# Patient Record
Sex: Female | Born: 1985 | Race: Black or African American | Hispanic: No | Marital: Single | Smoking: Current every day smoker
Health system: Southern US, Community
[De-identification: ages and names within clinical notes are randomized; demographics above are authoritative.]

## PROBLEM LIST (undated history)

## (undated) DIAGNOSIS — R51 Headache: Secondary | ICD-10-CM

## (undated) DIAGNOSIS — R519 Headache, unspecified: Secondary | ICD-10-CM

## (undated) DIAGNOSIS — N83209 Unspecified ovarian cyst, unspecified side: Secondary | ICD-10-CM

## (undated) DIAGNOSIS — I10 Essential (primary) hypertension: Secondary | ICD-10-CM

---

## 2010-03-30 ENCOUNTER — Emergency Department (HOSPITAL_BASED_OUTPATIENT_CLINIC_OR_DEPARTMENT_OTHER)
Admission: EM | Admit: 2010-03-30 | Discharge: 2010-03-30 | Payer: Self-pay | Source: Home / Self Care | Admitting: Emergency Medicine

## 2010-04-01 ENCOUNTER — Emergency Department (HOSPITAL_BASED_OUTPATIENT_CLINIC_OR_DEPARTMENT_OTHER)
Admission: EM | Admit: 2010-04-01 | Discharge: 2010-04-01 | Payer: Self-pay | Source: Home / Self Care | Admitting: Emergency Medicine

## 2010-06-25 LAB — URINALYSIS, ROUTINE W REFLEX MICROSCOPIC
Bilirubin Urine: NEGATIVE
Glucose, UA: NEGATIVE mg/dL
Ketones, ur: >80 mg/dL — AB
Nitrite: NEGATIVE
Nitrite: NEGATIVE
Specific Gravity, Urine: 1.018 (ref 1.005–1.030)
Specific Gravity, Urine: 1.023 (ref 1.005–1.030)
Urobilinogen, UA: 1 mg/dL (ref 0.0–1.0)
pH: 6 (ref 5.0–8.0)
pH: 6 (ref 5.0–8.0)

## 2010-06-25 LAB — BASIC METABOLIC PANEL
BUN: 8 mg/dL (ref 6–23)
Calcium: 8.5 mg/dL (ref 8.4–10.5)
Chloride: 104 mEq/L (ref 96–112)
Creatinine, Ser: 0.7 mg/dL (ref 0.4–1.2)
GFR calc Af Amer: >60 mL/min (ref >60)
GFR calc non Af Amer: >60 mL/min (ref >60)

## 2010-06-25 LAB — URINE MICROSCOPIC-ADD ON

## 2010-06-25 LAB — URINE CULTURE
Culture  Setup Time: 201112170628
Culture: NO GROWTH

## 2010-06-25 LAB — PREGNANCY, URINE
Preg Test, Ur: NEGATIVE
Preg Test, Ur: NEGATIVE

## 2010-06-25 LAB — WET PREP, GENITAL: Yeast Wet Prep HPF POC: NONE SEEN

## 2011-01-18 ENCOUNTER — Encounter: Payer: Self-pay | Admitting: *Deleted

## 2011-01-18 ENCOUNTER — Emergency Department (HOSPITAL_BASED_OUTPATIENT_CLINIC_OR_DEPARTMENT_OTHER): Payer: Medicaid Other

## 2011-01-18 ENCOUNTER — Emergency Department (HOSPITAL_BASED_OUTPATIENT_CLINIC_OR_DEPARTMENT_OTHER)
Admission: EM | Admit: 2011-01-18 | Discharge: 2011-01-19 | Disposition: A | Discharge status: Home / Self Care | Payer: Medicaid Other | Attending: Emergency Medicine | Admitting: Emergency Medicine

## 2011-01-18 DIAGNOSIS — F17200 Nicotine dependence, unspecified, uncomplicated: Secondary | ICD-10-CM | POA: Insufficient documentation

## 2011-01-18 DIAGNOSIS — J329 Chronic sinusitis, unspecified: Secondary | ICD-10-CM | POA: Insufficient documentation

## 2011-01-18 DIAGNOSIS — Z3201 Encounter for pregnancy test, result positive: Secondary | ICD-10-CM | POA: Insufficient documentation

## 2011-01-18 DIAGNOSIS — J069 Acute upper respiratory infection, unspecified: Secondary | ICD-10-CM | POA: Insufficient documentation

## 2011-01-18 LAB — URINALYSIS, ROUTINE W REFLEX MICROSCOPIC
Glucose, UA: NEGATIVE mg/dL
Ketones, ur: NEGATIVE mg/dL
Protein, ur: NEGATIVE mg/dL
Urobilinogen, UA: 1 mg/dL (ref 0.0–1.0)

## 2011-01-18 LAB — URINE MICROSCOPIC-ADD ON

## 2011-01-18 NOTE — ED Notes (Signed)
Pt presents to ED today with cold/URI sx for the last 3 weeks.  Pt has tried several otc remedies with no relief in sx.

## 2011-01-19 MED ORDER — CETIRIZINE-PSEUDOEPHEDRINE ER 5-120 MG PO TB12: 1.0000 | ORAL_TABLET | Freq: Every day | ORAL | Status: AC

## 2011-01-19 MED ORDER — ALBUTEROL SULFATE HFA 108 (90 BASE) MCG/ACT IN AERS
2.0000 | INHALATION_SPRAY | Freq: Once | RESPIRATORY_TRACT | Status: AC
  Administered 2011-01-19: 2 via RESPIRATORY_TRACT
  Filled 2011-01-19: qty 6.7

## 2011-01-19 MED ORDER — AZITHROMYCIN 250 MG PO TABS: 250.0000 mg | ORAL_TABLET | Freq: Every day | ORAL | Status: AC

## 2011-01-19 NOTE — ED Provider Notes (Signed)
History     CSN: [id number] Arrival date & time: 01/18/2011 11:32 PM  Chief Complaint  Patient presents with  . URI  . Cough    (Consider location/radiation/quality/duration/timing/severity/associated sxs/prior treatment) HPI Comments: 25 year old female with a history of approximately one month of gradual onset cough and nasal congestion. This came on the same time that her children develop similar symptoms. Her symptoms have been overall constant, daily, gradually worsening and now associated with significant postnasal drip, sinus tenderness on the left and a persistent cough. She denies fevers, vomiting, swelling or rashes. She has missed one menstrual period and is possibly pregnant.  Patient is a 25 y.o. female presenting with URI and cough. The history is provided by the patient.  URI The primary symptoms include cough.  Cough    History reviewed. No pertinent past medical history.  History reviewed. No pertinent past surgical history.  No family history on file.  History  Substance Use Topics  . Smoking status: Current Everyday Smoker -- 0.5 packs/day    Types: Cigarettes  . Smokeless tobacco: Not on file  . Alcohol Use:     OB History    Grav Para Term Preterm Abortions TAB SAB Ect Mult Living                  Review of Systems  Respiratory: Positive for cough.   All other systems reviewed and are negative.    Allergies  Review of patient's allergies indicates no known allergies.  Home Medications  No current outpatient prescriptions on file.  BP 114/98  Pulse 101  Temp(Src) 98.7 F (37.1 C) (Oral)  Resp 20  SpO2 99%  LMP 11/28/2010  Physical Exam  Nursing note and vitals reviewed. Constitutional: She appears well-developed and well-nourished. No distress.  HENT:  Head: Normocephalic and atraumatic.  Mouth/Throat: Oropharynx is clear and moist. No oropharyngeal exudate.       Significant nasal swelling of the turbinates bilaterally with  discharge. Tenderness over the left maxillary sinus  Eyes: Conjunctivae and EOM are normal. Pupils are equal, round, and reactive to light. Right eye exhibits no discharge. Left eye exhibits no discharge. No scleral icterus.  Neck: Normal range of motion. Neck supple. No JVD present. No thyromegaly present.  Cardiovascular: Normal rate, regular rhythm, normal heart sounds and intact distal pulses.  Exam reveals no gallop and no friction rub.   No murmur heard. Pulmonary/Chest: Effort normal and breath sounds normal. No respiratory distress. She has no wheezes. She has no rales.       Lungs clear, no rales no wheezing and normal vital signs including oxygen saturation of 99%  Abdominal: Soft. Bowel sounds are normal. She exhibits no distension and no mass. There is no tenderness.       Soft, nontender abdomen diffusely, no suprapubic tenderness.  Musculoskeletal: Normal range of motion. She exhibits no edema and no tenderness.  Lymphadenopathy:    She has no cervical adenopathy.  Neurological: She is alert. Coordination normal.  Skin: Skin is warm and dry. No rash noted. No erythema.  Psychiatric: She has a normal mood and affect. Her behavior is normal.    ED Course  Procedures (including critical care time)  Labs Reviewed  URINALYSIS, ROUTINE W REFLEX MICROSCOPIC - Abnormal; Notable for the following:    Leukocytes, UA SMALL (*)    All other components within normal limits  URINE MICROSCOPIC-ADD ON - Abnormal; Notable for the following:    Squamous Epithelial / LPF MANY (*)  All other components within normal limits  PREGNANCY, URINE   No results found.   No diagnosis found.    MDM  Lab testing shows pregnancy, patient states she wants to have an abortion. She knows where to go for this and has had one in the past. She declines x-ray as we will be treating her with antibiotics for sinusitis and upper respiratory infection. Albuterol MDI given in the emergency Department with  teaching and discharged the patient for home. Vital signs normal, gout pneumonia. Followup encouraged within 3-4 days        Vida Roller, MD 01/19/11 [id number]

## 2012-05-13 ENCOUNTER — Encounter (HOSPITAL_BASED_OUTPATIENT_CLINIC_OR_DEPARTMENT_OTHER): Payer: Self-pay | Admitting: *Deleted

## 2012-05-13 ENCOUNTER — Emergency Department (HOSPITAL_BASED_OUTPATIENT_CLINIC_OR_DEPARTMENT_OTHER)
Admission: EM | Admit: 2012-05-13 | Discharge: 2012-05-13 | Disposition: A | Discharge status: Home / Self Care | Payer: Self-pay | Attending: Emergency Medicine | Admitting: Emergency Medicine

## 2012-05-13 DIAGNOSIS — F17200 Nicotine dependence, unspecified, uncomplicated: Secondary | ICD-10-CM | POA: Insufficient documentation

## 2012-05-13 DIAGNOSIS — J069 Acute upper respiratory infection, unspecified: Secondary | ICD-10-CM | POA: Insufficient documentation

## 2012-05-13 DIAGNOSIS — I10 Essential (primary) hypertension: Secondary | ICD-10-CM | POA: Insufficient documentation

## 2012-05-13 DIAGNOSIS — IMO0001 Reserved for inherently not codable concepts without codable children: Secondary | ICD-10-CM | POA: Insufficient documentation

## 2012-05-13 DIAGNOSIS — R059 Cough, unspecified: Secondary | ICD-10-CM | POA: Insufficient documentation

## 2012-05-13 DIAGNOSIS — R05 Cough: Secondary | ICD-10-CM | POA: Insufficient documentation

## 2012-05-13 HISTORY — DX: Essential (primary) hypertension: I10

## 2012-05-13 LAB — RAPID STREP SCREEN (MED CTR MEBANE ONLY): Streptococcus, Group A Screen (Direct): NEGATIVE

## 2012-05-13 NOTE — ED Notes (Signed)
Pt c/o URi symptoms x 2 days 

## 2012-05-13 NOTE — ED Provider Notes (Signed)
Medical screening examination/treatment/procedure(s) were performed by non-physician practitioner and as supervising physician I was immediately available for consultation/collaboration.   Charles B. Bernette Mayers, MD 05/13/12 2312

## 2012-05-13 NOTE — ED Provider Notes (Signed)
History     CSN: [id number]  Arrival date & time 05/13/12  4098   First MD Initiated Contact with Patient 05/13/12 1909      Chief Complaint  Patient presents with  . URI    (Consider location/radiation/quality/duration/timing/severity/associated sxs/prior treatment) HPI Comments: Patient is a 27 year old female who presents with a 2 day history of sore throat, non productive cough, and myalgias. Patient reports a gradual onset and progressive worsening since the onset. Patient has tried OTC remedies which have provided some relief. No aggravating/alleviating factors. Patient reports sick contacts at school.    Past Medical History  Diagnosis Date  . Hypertension     History reviewed. No pertinent past surgical history.  History reviewed. No pertinent family history.  History  Substance Use Topics  . Smoking status: Current Every Day Smoker -- 0.5 packs/day    Types: Cigarettes  . Smokeless tobacco: Not on file  . Alcohol Use:     OB History    Grav Para Term Preterm Abortions TAB SAB Ect Mult Living                  Review of Systems  Constitutional: Positive for fatigue.  HENT: Positive for sore throat.   Respiratory: Positive for cough.   Musculoskeletal: Positive for myalgias.  All other systems reviewed and are negative.    Allergies  Review of patient's allergies indicates no known allergies.  Home Medications  No current outpatient prescriptions on file.  BP 110/72  Pulse 93  Temp 98.5 F (36.9 C) (Oral)  Resp 16  Ht 5\' 2"  (1.575 m)  Wt 123 lb (55.792 kg)  BMI 22.50 kg/m2  SpO2 100%  Physical Exam  Nursing note and vitals reviewed. Constitutional: She is oriented to person, place, and time. She appears well-developed and well-nourished. No distress.  HENT:  Head: Normocephalic and atraumatic.  Mouth/Throat: Oropharynx is clear and moist. No oropharyngeal exudate.       Pharyngeal erythema.   Eyes: Conjunctivae normal are normal.  Neck:  Normal range of motion. Neck supple.  Cardiovascular: Normal rate and regular rhythm.  Exam reveals no gallop and no friction rub.   No murmur heard. Pulmonary/Chest: Effort normal and breath sounds normal. She has no wheezes. She has no rales. She exhibits no tenderness.  Abdominal: Soft. She exhibits no distension. There is no tenderness. There is no rebound.  Musculoskeletal: Normal range of motion.  Neurological: She is alert and oriented to person, place, and time. Coordination normal.       Speech is goal-oriented. Moves limbs without ataxia.   Skin: Skin is warm and dry. She is not diaphoretic.  Psychiatric: She has a normal mood and affect. Her behavior is normal.    ED Course  Procedures (including critical care time)  Labs Reviewed - No data to display No results found.   1. URI, acute       MDM  7:23 PM Patient will have rapid strep test. Patient afebrile with stable vitals.   8:15 PM Strep test negative. Patient will be discharged with instructions for symptomatic treatment. Patient afebrile with stable vitals. Patient instructed to return with worsening or concerning symptoms.       Emilia Beck, PA-C 05/13/12 2016

## 2012-09-12 ENCOUNTER — Emergency Department (HOSPITAL_BASED_OUTPATIENT_CLINIC_OR_DEPARTMENT_OTHER)
Admission: EM | Admit: 2012-09-12 | Discharge: 2012-09-12 | Disposition: A | Discharge status: Home / Self Care | Payer: Medicaid Other | Attending: Emergency Medicine | Admitting: Emergency Medicine

## 2012-09-12 ENCOUNTER — Encounter (HOSPITAL_BASED_OUTPATIENT_CLINIC_OR_DEPARTMENT_OTHER): Payer: Self-pay

## 2012-09-12 DIAGNOSIS — I10 Essential (primary) hypertension: Secondary | ICD-10-CM | POA: Insufficient documentation

## 2012-09-12 DIAGNOSIS — M549 Dorsalgia, unspecified: Secondary | ICD-10-CM | POA: Insufficient documentation

## 2012-09-12 DIAGNOSIS — R10817 Generalized abdominal tenderness: Secondary | ICD-10-CM | POA: Insufficient documentation

## 2012-09-12 DIAGNOSIS — R51 Headache: Secondary | ICD-10-CM | POA: Insufficient documentation

## 2012-09-12 DIAGNOSIS — B349 Viral infection, unspecified: Secondary | ICD-10-CM

## 2012-09-12 DIAGNOSIS — R059 Cough, unspecified: Secondary | ICD-10-CM | POA: Insufficient documentation

## 2012-09-12 DIAGNOSIS — F17200 Nicotine dependence, unspecified, uncomplicated: Secondary | ICD-10-CM | POA: Insufficient documentation

## 2012-09-12 DIAGNOSIS — J029 Acute pharyngitis, unspecified: Secondary | ICD-10-CM | POA: Insufficient documentation

## 2012-09-12 DIAGNOSIS — Z3202 Encounter for pregnancy test, result negative: Secondary | ICD-10-CM | POA: Insufficient documentation

## 2012-09-12 DIAGNOSIS — K5900 Constipation, unspecified: Secondary | ICD-10-CM | POA: Insufficient documentation

## 2012-09-12 DIAGNOSIS — R070 Pain in throat: Secondary | ICD-10-CM | POA: Insufficient documentation

## 2012-09-12 DIAGNOSIS — R6883 Chills (without fever): Secondary | ICD-10-CM | POA: Insufficient documentation

## 2012-09-12 DIAGNOSIS — R05 Cough: Secondary | ICD-10-CM | POA: Insufficient documentation

## 2012-09-12 DIAGNOSIS — B9789 Other viral agents as the cause of diseases classified elsewhere: Secondary | ICD-10-CM | POA: Insufficient documentation

## 2012-09-12 LAB — URINALYSIS, ROUTINE W REFLEX MICROSCOPIC
Ketones, ur: NEGATIVE mg/dL
Nitrite: NEGATIVE
Protein, ur: NEGATIVE mg/dL
pH: 6 (ref 5.0–8.0)

## 2012-09-12 MED ORDER — HYDROCOD POLST-CHLORPHEN POLST 10-8 MG/5ML PO LQCR: 5.0000 mL | Freq: Two times a day (BID) | ORAL | Status: DC | PRN

## 2012-09-12 NOTE — ED Provider Notes (Signed)
History  This chart was scribed for Hanley Seamen, MD by Ardelia Mems, ED Scribe. This patient was seen in room MH04/MH04 and the patient's care was started at 11:21 PM.   CSN: [id number]  Arrival date & time 09/12/12  2053     Chief Complaint  Patient presents with  . General Malaise      The history is provided by the patient. No language interpreter was used.    HPI Comments: Elizabeth Duffy is a 27 y.o. female who presents to the Emergency Department complaining of general malaise. She reports moderate pain in her throat, head and back onset yesterday morning. Pt states that her throat is now mildly sore and has improved since yesterday. Pt states that she has dry cough. Pt states that she has not had a BM in 2 days and believes she is constipated. Pt states she has been having chills. Pt states that she has been taking BC and Aleve with no relief of pain. Pt denies rhinorrhea, congestion, fever, nausea, vomiting, diarrhea, ear aches or any other symptoms.   Past Medical History  Diagnosis Date  . Hypertension     History reviewed. No pertinent past surgical history.  No family history on file.  History  Substance Use Topics  . Smoking status: Current Every Day Smoker -- 0.50 packs/day    Types: Cigarettes  . Smokeless tobacco: Not on file  . Alcohol Use:     OB History   Grav Para Term Preterm Abortions TAB SAB Ect Mult Living                  Review of Systems  Constitutional: Positive for chills. Negative for fever.  HENT: Positive for sore throat. Negative for ear pain, congestion and rhinorrhea.   Respiratory: Positive for cough.   Gastrointestinal: Positive for constipation. Negative for nausea, vomiting and diarrhea.  Musculoskeletal: Positive for back pain.  Neurological: Positive for headaches.  All other systems reviewed and are negative.    Allergies  Review of patient's allergies indicates no known allergies.  Home Medications  No current  outpatient prescriptions on file.  Triage Vitals: BP 107/58  Pulse 86  Temp(Src) 98.7 F (37.1 C) (Oral)  Resp 18  Wt 121 lb (54.885 kg)  BMI 22.13 kg/m2  SpO2 99%  Physical Exam  Nursing note and vitals reviewed. Constitutional: She is oriented to person, place, and time. She appears well-developed and well-nourished.  HENT:  Head: Normocephalic and atraumatic.  Throat looks normal. No cervical lymphadenopathy.  Eyes: EOM are normal. Pupils are equal, round, and reactive to light.  Neck: Normal range of motion. Neck supple. No tracheal deviation present.  Cardiovascular: Normal rate, regular rhythm and normal heart sounds.   No murmur heard. Pulmonary/Chest: Effort normal and breath sounds normal. No respiratory distress.  Abdominal: Soft. Bowel sounds are normal. There is no tenderness.  Mild, diffuse abdominal tenderness.  Musculoskeletal: Normal range of motion. She exhibits no tenderness.  Neurological: She is alert and oriented to person, place, and time.  Skin: Skin is warm. No rash noted.    ED Course  Procedures (including critical care time)  DIAGNOSTIC STUDIES: Oxygen Saturation is 99% on RA, normal by my interpretation.    COORDINATION OF CARE: 11:27 PM- Pt advised of plan for treatment and pt agrees.      MDM   Nursing notes and vitals signs, including pulse oximetry, reviewed.  Summary of this visit's results, reviewed by myself:  Labs:  Results for orders placed during the hospital encounter of 09/12/12 (from the past 24 hour(s))  URINALYSIS, ROUTINE W REFLEX MICROSCOPIC     Status: Abnormal   Collection Time    09/12/12  9:18 PM      Result Value Range   Color, Urine YELLOW  YELLOW   APPearance CLOUDY (*) CLEAR   Specific Gravity, Urine 1.025  1.005 - 1.030   pH 6.0  5.0 - 8.0   Glucose, UA NEGATIVE  NEGATIVE mg/dL   Hgb urine dipstick NEGATIVE  NEGATIVE   Bilirubin Urine NEGATIVE  NEGATIVE   Ketones, ur NEGATIVE  NEGATIVE mg/dL    Protein, ur NEGATIVE  NEGATIVE mg/dL   Urobilinogen, UA 1.0  0.0 - 1.0 mg/dL   Nitrite NEGATIVE  NEGATIVE   Leukocytes, UA TRACE (*) NEGATIVE  PREGNANCY, URINE     Status: None   Collection Time    09/12/12  9:18 PM      Result Value Range   Preg Test, Ur NEGATIVE  NEGATIVE  URINE MICROSCOPIC-ADD ON     Status: Abnormal   Collection Time    09/12/12  9:18 PM      Result Value Range   Squamous Epithelial / LPF MANY (*) RARE   WBC, UA 3-6  <3 WBC/hpf   RBC / HPF 0-2  <3 RBC/hpf   Bacteria, UA MANY (*) RARE   Urine-Other MUCOUS PRESENT    RAPID STREP SCREEN     Status: None   Collection Time    09/12/12 11:31 PM      Result Value Range   Streptococcus, Group A Screen (Direct) NEGATIVE  NEGATIVE            I personally performed the services described in this documentation, which was scribed in my presence.  The recorded information has been reviewed and considered.    Hanley Seamen, MD 09/12/12 2351

## 2012-09-12 NOTE — ED Notes (Signed)
Patient here with general aching, headache, dry cough and congestion. Taking otc meds without relief

## 2012-09-12 NOTE — ED Notes (Signed)
MD at bedside. 

## 2012-09-13 ENCOUNTER — Emergency Department (HOSPITAL_BASED_OUTPATIENT_CLINIC_OR_DEPARTMENT_OTHER)
Admission: EM | Admit: 2012-09-13 | Discharge: 2012-09-14 | Disposition: A | Discharge status: Home / Self Care | Payer: Medicaid Other | Attending: Emergency Medicine | Admitting: Emergency Medicine

## 2012-09-13 ENCOUNTER — Encounter (HOSPITAL_BASED_OUTPATIENT_CLINIC_OR_DEPARTMENT_OTHER): Payer: Self-pay | Admitting: *Deleted

## 2012-09-13 DIAGNOSIS — F17200 Nicotine dependence, unspecified, uncomplicated: Secondary | ICD-10-CM | POA: Insufficient documentation

## 2012-09-13 DIAGNOSIS — I10 Essential (primary) hypertension: Secondary | ICD-10-CM | POA: Insufficient documentation

## 2012-09-13 DIAGNOSIS — R6883 Chills (without fever): Secondary | ICD-10-CM | POA: Insufficient documentation

## 2012-09-13 DIAGNOSIS — B9789 Other viral agents as the cause of diseases classified elsewhere: Secondary | ICD-10-CM | POA: Insufficient documentation

## 2012-09-13 DIAGNOSIS — B349 Viral infection, unspecified: Secondary | ICD-10-CM

## 2012-09-13 DIAGNOSIS — R51 Headache: Secondary | ICD-10-CM | POA: Insufficient documentation

## 2012-09-13 DIAGNOSIS — J3489 Other specified disorders of nose and nasal sinuses: Secondary | ICD-10-CM | POA: Insufficient documentation

## 2012-09-13 DIAGNOSIS — J029 Acute pharyngitis, unspecified: Secondary | ICD-10-CM | POA: Insufficient documentation

## 2012-09-13 DIAGNOSIS — IMO0001 Reserved for inherently not codable concepts without codable children: Secondary | ICD-10-CM | POA: Insufficient documentation

## 2012-09-13 NOTE — ED Notes (Signed)
Pt state she was seen here in the ED last and was not able to get her pain meds filled. States she feels much worse today. C/o pain to posterior neck down to her lower legs bilateral. C/o chills. Denies any fevers. Denies any sore throat. C/o dry cough.

## 2012-09-13 NOTE — ED Provider Notes (Signed)
History    This chart was scribed for Elizabeth Jakes, MD by Quintella Reichert, ED scribe.  This patient was seen in room MH07/MH07 and the patient's care was started at 10:43 PM.   CSN: [id number]  Arrival date & time 09/13/12  2055     Chief Complaint  Patient presents with  . coughing and congestion      Patient is a 27 y.o. female presenting with cough. The history is provided by the patient. No language interpreter was used.  Cough Cough characteristics:  Dry Severity:  Moderate Onset quality:  Gradual Duration:  2 days Progression:  Worsening Chronicity:  New Relieved by:  None tried Worsened by:  Nothing tried Ineffective treatments:  None tried Associated symptoms: chills, headaches and sore throat   Associated symptoms: no chest pain, no fever, no rash and no rhinorrhea   Associated symptoms comment:  Body aches, malaise   HPI Comments: Elizabeth Duffy is a 27 y.o. female who presents to the Emergency Department complaining of gradually-worsening generalized body aches that began 2 days ago, with accompanying malaise, dry cough, progressively-worsening headache in bilateral temples, sore throat, and intermittent chills and hot flashes.  Pt describes body aches as localized to posterior neck, lower back, and lower legs.  She was seen in the ED yesterday and diagnosed with a likely viral syndrome.  She has attempted to treat symptoms with [facility] Powder, Tylenol and Aleve, without relief.  She also notes she has not had a BM since symptoms began.  She denies objective fever, rhinorrhea, nausea, emesis, dysuria, CP, abdominal pain, or rash.  She denies h/o bleeding easily.      Past Medical History  Diagnosis Date  . Hypertension     History reviewed. No pertinent past surgical history.  No family history on file.  History  Substance Use Topics  . Smoking status: Current Every Day Smoker -- 0.50 packs/day    Types: Cigarettes  . Smokeless tobacco: Not on file  .  Alcohol Use: Yes     Comment: occasional     OB History   Grav Para Term Preterm Abortions TAB SAB Ect Mult Living                  Review of Systems  Constitutional: Positive for chills. Negative for fever.  HENT: Positive for sore throat. Negative for rhinorrhea.   Respiratory: Positive for cough.   Cardiovascular: Negative for chest pain.  Gastrointestinal: Negative for nausea, vomiting, abdominal pain and diarrhea.  Genitourinary: Negative for dysuria.  Skin: Negative for rash.  Neurological: Positive for headaches.  Hematological: Does not bruise/bleed easily.  Psychiatric/Behavioral: Negative for confusion.    Allergies  Review of patient's allergies indicates no known allergies.  Home Medications   Current Outpatient Rx  Name  Route  Sig  Dispense  Refill  . chlorpheniramine-HYDROcodone (TUSSIONEX PENNKINETIC ER) 10-8 MG/5ML LQCR   Oral   Take 5 mLs by mouth every 12 (twelve) hours as needed (for cough or pain).   115 mL   0     BP 109/69  Pulse 80  Temp(Src) 98.4 F (36.9 C) (Oral)  SpO2 98%  Physical Exam  Nursing note and vitals reviewed. Constitutional: She is oriented to person, place, and time. She appears well-developed and well-nourished. No distress.  HENT:  Head: Normocephalic and atraumatic.  Mouth/Throat: Oropharynx is clear and moist.  Eyes: Conjunctivae and EOM are normal. Pupils are equal, round, and reactive to light. No scleral icterus.  Neck: Normal range of motion. Neck supple.  Cardiovascular: Normal rate, regular rhythm and normal heart sounds.   No murmur heard. Pulmonary/Chest: Effort normal and breath sounds normal. No respiratory distress. She has no wheezes. She has no rales.  Abdominal: Soft. Bowel sounds are normal. There is no tenderness.  Musculoskeletal: Normal range of motion. She exhibits no edema and no tenderness.  Lymphadenopathy:    She has no cervical adenopathy.  Neurological: She is alert and oriented to person,  place, and time.  Skin: Skin is warm and dry. No rash noted.  Psychiatric: She has a normal mood and affect. Her behavior is normal.    ED Course  Procedures (including critical care time)  DIAGNOSTIC STUDIES: Oxygen Saturation is 98% on room air, normal by my interpretation.    COORDINATION OF CARE: 10:50 PM-Discussed treatment plan which includes further evaluation of pt's records with pt at bedside and pt agreed to plan.      Labs Reviewed  CBC WITH DIFFERENTIAL  BASIC METABOLIC PANEL   No results found. Results for orders placed during the hospital encounter of 09/12/12  RAPID STREP SCREEN      Result Value Range   Streptococcus, Group A Screen (Direct) NEGATIVE  NEGATIVE  URINALYSIS, ROUTINE W REFLEX MICROSCOPIC      Result Value Range   Color, Urine YELLOW  YELLOW   APPearance CLOUDY (*) CLEAR   Specific Gravity, Urine 1.025  1.005 - 1.030   pH 6.0  5.0 - 8.0   Glucose, UA NEGATIVE  NEGATIVE mg/dL   Hgb urine dipstick NEGATIVE  NEGATIVE   Bilirubin Urine NEGATIVE  NEGATIVE   Ketones, ur NEGATIVE  NEGATIVE mg/dL   Protein, ur NEGATIVE  NEGATIVE mg/dL   Urobilinogen, UA 1.0  0.0 - 1.0 mg/dL   Nitrite NEGATIVE  NEGATIVE   Leukocytes, UA TRACE (*) NEGATIVE  PREGNANCY, URINE      Result Value Range   Preg Test, Ur NEGATIVE  NEGATIVE  URINE MICROSCOPIC-ADD ON      Result Value Range   Squamous Epithelial / LPF MANY (*) RARE   WBC, UA 3-6  <3 WBC/hpf   RBC / HPF 0-2  <3 RBC/hpf   Bacteria, UA MANY (*) RARE   Urine-Other MUCOUS PRESENT       1. Viral illness       MDM  Patient seen last evening. With a negative urinalysis pregnancy test and strep test. Patient still not feeling well. Sore throat is pre-much resolved now has a dry cough bodyaches and the headache. No fevers. No nausea vomiting or diarrhea no real congestion. Suspect this is probably a viral illness. The patient would like to have further evaluation will do chest x-ray and basic labs.  The  patient no longer requires any medication for the cough. Anti-inflammatory medicine would probably be helpful.   I personally performed the services described in this documentation, which was scribed in my presence. The recorded information has been reviewed and is accurate.     Elizabeth Jakes, MD 09/13/12 2350

## 2012-09-14 ENCOUNTER — Emergency Department (HOSPITAL_BASED_OUTPATIENT_CLINIC_OR_DEPARTMENT_OTHER): Payer: Medicaid Other

## 2012-09-14 LAB — CBC WITH DIFFERENTIAL/PLATELET
Eosinophils Relative: 1 % (ref 0–5)
HCT: 32.9 % — ABNORMAL LOW (ref 36.0–46.0)
Hemoglobin: 11.3 g/dL — ABNORMAL LOW (ref 12.0–15.0)
Lymphocytes Relative: 31 % (ref 12–46)
Lymphs Abs: 1.1 10*3/uL (ref 0.7–4.0)
MCV: 86.1 fL (ref 78.0–100.0)
Monocytes Absolute: 0.5 10*3/uL (ref 0.1–1.0)
RBC: 3.82 MIL/uL — ABNORMAL LOW (ref 3.87–5.11)
WBC: 3.5 10*3/uL — ABNORMAL LOW (ref 4.0–10.5)

## 2012-09-14 LAB — URINE CULTURE: Colony Count: >=100000

## 2012-09-14 LAB — BASIC METABOLIC PANEL
CO2: 22 mEq/L (ref 19–32)
Calcium: 8.9 mg/dL (ref 8.4–10.5)
Creatinine, Ser: 0.7 mg/dL (ref 0.50–1.10)
Glucose, Bld: 112 mg/dL — ABNORMAL HIGH (ref 70–99)
Sodium: 135 mEq/L (ref 135–145)

## 2012-09-14 MED ORDER — HYDROCODONE-ACETAMINOPHEN 5-325 MG PO TABS
ORAL_TABLET | ORAL | Status: AC
  Administered 2012-09-14: 1
  Filled 2012-09-14: qty 1

## 2012-09-14 NOTE — ED Notes (Signed)
Patient transported to X-ray via stretcher 

## 2013-01-16 ENCOUNTER — Encounter (HOSPITAL_BASED_OUTPATIENT_CLINIC_OR_DEPARTMENT_OTHER): Payer: Self-pay | Admitting: Emergency Medicine

## 2013-01-16 ENCOUNTER — Emergency Department (HOSPITAL_BASED_OUTPATIENT_CLINIC_OR_DEPARTMENT_OTHER)
Admission: EM | Admit: 2013-01-16 | Discharge: 2013-01-16 | Disposition: A | Discharge status: Home / Self Care | Payer: Medicaid Other | Attending: Emergency Medicine | Admitting: Emergency Medicine

## 2013-01-16 DIAGNOSIS — F17200 Nicotine dependence, unspecified, uncomplicated: Secondary | ICD-10-CM | POA: Insufficient documentation

## 2013-01-16 DIAGNOSIS — B86 Scabies: Secondary | ICD-10-CM

## 2013-01-16 MED ORDER — PERMETHRIN 5 % EX CREA: TOPICAL_CREAM | CUTANEOUS | Status: DC

## 2013-01-16 NOTE — ED Provider Notes (Signed)
CSN: [id number]     Arrival date & time 01/16/13  3086 History   First MD Initiated Contact with Patient 01/16/13 0256     Chief Complaint  Patient presents with  . Rash   (Consider location/radiation/quality/duration/timing/severity/associated sxs/prior Treatment) HPI Pt presenting with pruritic rash on her back, stomach, arms- this has been present for approx 2 weeks after noticing her son had an itchy rash.  No fever.  States she has been scratching so hard she has had bleeding of the skin intermittently.  Has not had any treatment prior to arrival.  Tonight came to the ED with her 2 sons that have similar symptoms.  They were unable to sleep due to the itching.  There are no other associated systemic symptoms, there are no other alleviating or modifying factors.   History reviewed. No pertinent past medical history. History reviewed. No pertinent past surgical history. History reviewed. No pertinent family history. History  Substance Use Topics  . Smoking status: Current Every Day Smoker -- 0.50 packs/day    Types: Cigarettes  . Smokeless tobacco: Not on file  . Alcohol Use: Yes     Comment: occasional    OB History   Grav Para Term Preterm Abortions TAB SAB Ect Mult Living                 Review of Systems ROS reviewed and all otherwise negative except for mentioned in HPI  Allergies  Review of patient's allergies indicates no known allergies.  Home Medications   Current Outpatient Rx  Name  Route  Sig  Dispense  Refill  . chlorpheniramine-HYDROcodone (TUSSIONEX PENNKINETIC ER) 10-8 MG/5ML LQCR   Oral   Take 5 mLs by mouth every 12 (twelve) hours as needed (for cough or pain).   115 mL   0   . permethrin (ELIMITE) 5 % cream      Apply head to toe- leave on for 8-13 hours then rinse off.  You may repeat this treatment once if no improvement in 3-4 days   60 g   0    BP 108/67  Pulse 68  Temp(Src) 98.3 F (36.8 C) (Oral)  Resp 18  Ht 5\' 10"  (1.778 m)  Wt 133  lb (60.328 kg)  BMI 19.08 kg/m2  SpO2 100% Vitals reviewed Physical Exam  Physical Examination: General appearance - alert, well appearing, and in no distress Mental status - alert, oriented to person, place, and time Eyes - no conjunctival injection, no scleral icterus Chest - clear to auscultation, no wheezes, rales or rhonchi, symmetric air entry Heart - normal rate, regular rhythm, normal S1, S2, no murmurs, rubs, clicks or gallops Musculoskeletal - no joint tenderness, deformity or swelling Extremities - peripheral pulses normal, no pedal edema, no clubbing or cyanosis Skin - normal coloration and turgor, flesh colored papules over stomach, back, bilateral arms- some with burrows, some with excoriations present  ED Course  Procedures (including critical care time) Labs Review Labs Reviewed - No data to display Imaging Review No results found.  MDM   1. Scabies    Pt presenting with intensely pruritic rash most c/w scabies.  Other household contacts with similar symptoms as well.  Given rx for permethrin.  Benadryl as needed for itching.  Discharged with strict return precautions.  Pt agreeable with plan.   Ethelda Chick, MD 01/16/13 [phone]

## 2013-01-16 NOTE — ED Notes (Signed)
Mom noticed rash after being exposed to son's rash on her back, buttocks and legs, + uticaria, reports bleeding at times

## 2013-02-18 ENCOUNTER — Emergency Department (HOSPITAL_BASED_OUTPATIENT_CLINIC_OR_DEPARTMENT_OTHER): Payer: Medicaid Other

## 2013-02-18 ENCOUNTER — Emergency Department (HOSPITAL_BASED_OUTPATIENT_CLINIC_OR_DEPARTMENT_OTHER)
Admission: EM | Admit: 2013-02-18 | Discharge: 2013-02-19 | Disposition: A | Discharge status: Home / Self Care | Payer: Medicaid Other | Attending: Emergency Medicine | Admitting: Emergency Medicine

## 2013-02-18 ENCOUNTER — Encounter (HOSPITAL_BASED_OUTPATIENT_CLINIC_OR_DEPARTMENT_OTHER): Payer: Self-pay | Admitting: Emergency Medicine

## 2013-02-18 DIAGNOSIS — Z3202 Encounter for pregnancy test, result negative: Secondary | ICD-10-CM | POA: Insufficient documentation

## 2013-02-18 DIAGNOSIS — F17200 Nicotine dependence, unspecified, uncomplicated: Secondary | ICD-10-CM | POA: Insufficient documentation

## 2013-02-18 DIAGNOSIS — N390 Urinary tract infection, site not specified: Secondary | ICD-10-CM | POA: Insufficient documentation

## 2013-02-18 DIAGNOSIS — M545 Low back pain, unspecified: Secondary | ICD-10-CM | POA: Insufficient documentation

## 2013-02-18 DIAGNOSIS — M940 Chondrocostal junction syndrome [Tietze]: Secondary | ICD-10-CM | POA: Insufficient documentation

## 2013-02-18 DIAGNOSIS — R110 Nausea: Secondary | ICD-10-CM | POA: Insufficient documentation

## 2013-02-18 LAB — URINALYSIS, ROUTINE W REFLEX MICROSCOPIC
Specific Gravity, Urine: 1.022 (ref 1.005–1.030)
Urobilinogen, UA: 1 mg/dL (ref 0.0–1.0)

## 2013-02-18 LAB — PREGNANCY, URINE: Preg Test, Ur: NEGATIVE

## 2013-02-18 LAB — URINE MICROSCOPIC-ADD ON

## 2013-02-18 MED ORDER — NAPROXEN SODIUM 275 MG PO TABS: 275.0000 mg | ORAL_TABLET | Freq: Two times a day (BID) | ORAL | Status: DC | PRN

## 2013-02-18 MED ORDER — NITROFURANTOIN MONOHYD MACRO 100 MG PO CAPS
100.0000 mg | ORAL_CAPSULE | Freq: Once | ORAL | Status: AC
  Administered 2013-02-18: 100 mg via ORAL
  Filled 2013-02-18: qty 1

## 2013-02-18 MED ORDER — NITROFURANTOIN MONOHYD MACRO 100 MG PO CAPS: 100.0000 mg | ORAL_CAPSULE | Freq: Two times a day (BID) | ORAL | Status: DC

## 2013-02-18 MED ORDER — NAPROXEN 250 MG PO TABS
500.0000 mg | ORAL_TABLET | Freq: Once | ORAL | Status: AC
  Administered 2013-02-18: 500 mg via ORAL
  Filled 2013-02-18 (×2): qty 2

## 2013-02-18 NOTE — ED Notes (Signed)
Onset of chest and back pain last pm  States hurts to talk or cough or take a deep breath

## 2013-02-18 NOTE — ED Provider Notes (Signed)
CSN: [id number]     Arrival date & time 02/18/13  2109 History  This chart was scribed for Hanley Seamen, MD by Ronal Fear, ED Scribe. This patient was seen in room MH03/MH03 and the patient's care was started at 11:15 PM.     Chief Complaint  Patient presents with  . Chest Pain   Patient is a 27 y.o. female presenting with chest pain. The history is provided by the patient. No language interpreter was used.  Chest Pain   HPI Comments: Elizabeth Duffy is a 27 y.o. female who presents to the Emergency Department complaining of tight dull chest pain in her right sternal area with associated onset last night. Pain is moderate, and worse with breathing, talking or palpation. She denies cough, fever or vomiting. She has had some nausea. She also complains of lower back pain right above her sacrum. She has no other complaints.   History reviewed. No pertinent past medical history. History reviewed. No pertinent past surgical history. No family history on file. History  Substance Use Topics  . Smoking status: Current Every Day Smoker -- 0.50 packs/day    Types: Cigarettes  . Smokeless tobacco: Not on file  . Alcohol Use: Yes     Comment: occasional    OB History   Grav Para Term Preterm Abortions TAB SAB Ect Mult Living                 Review of Systems  Cardiovascular: Positive for chest pain.  10 Systems reviewed and are negative for acute change except as noted in the HPI.   Allergies  Review of patient's allergies indicates no known allergies.  Home Medications  No current outpatient prescriptions on file. BP 113/74  Pulse 65  Temp(Src) 97.8 F (36.6 C) (Oral)  Resp 15  SpO2 100%  Physical Exam General: Well-developed, well-nourished female in no acute distress; appearance consistent with age of record. HENT: normocephalic; atraumatic Eyes: pupils equal, round and reactive to light; extraocular muscles intact Neck: supple Heart: regular rate and rhythm; no  murmurs, rubs or gallops Lungs: clear to auscultation bilaterally Chest: right parasternal tenderness without deformity or crepitus Abdomen: soft; nondistended; nontender; no masses or hepatosplenomegaly; bowel sounds present Extremities: No deformity; full range of motion; pulses normal Neurologic: Awake, alert and oriented; motor function intact in all extremities and symmetric; no facial droop Skin: Warm and dry Psychiatric: Normal mood and affect   ED Course  Procedures (including critical care time) DIAGNOSTIC STUDIES: Oxygen Saturation is 100% on RA, normal by my interpretation.    COORDINATION OF CARE:    11:20 PM- Pt advised of plan for treatment including review of chest X-ray and UA results and pt agrees.  Imaging Review   EKG Interpretation     Ventricular Rate:  79 PR Interval:  148 QRS Duration: 78 QT Interval:  376 QTC Calculation: 431 R Axis:   67 Text Interpretation:  Normal sinus rhythm Normal ECG No previous ECGs available      MDM  Nursing notes and vitals signs, including pulse oximetry, reviewed.  Summary of this visit's results, reviewed by myself:  Labs:  Results for orders placed during the hospital encounter of 02/18/13 (from the past 24 hour(s))  URINALYSIS, ROUTINE W REFLEX MICROSCOPIC     Status: Abnormal   Collection Time    02/18/13 11:32 PM      Result Value Range   Color, Urine YELLOW  YELLOW   APPearance CLOUDY (*) CLEAR  Specific Gravity, Urine 1.022  1.005 - 1.030   pH 7.5  5.0 - 8.0   Glucose, UA NEGATIVE  NEGATIVE mg/dL   Hgb urine dipstick LARGE (*) NEGATIVE   Bilirubin Urine NEGATIVE  NEGATIVE   Ketones, ur NEGATIVE  NEGATIVE mg/dL   Protein, ur NEGATIVE  NEGATIVE mg/dL   Urobilinogen, UA 1.0  0.0 - 1.0 mg/dL   Nitrite POSITIVE (*) NEGATIVE   Leukocytes, UA SMALL (*) NEGATIVE  PREGNANCY, URINE     Status: None   Collection Time    02/18/13 11:32 PM      Result Value Range   Preg Test, Ur NEGATIVE  NEGATIVE  URINE  MICROSCOPIC-ADD ON     Status: Abnormal   Collection Time    02/18/13 11:32 PM      Result Value Range   Squamous Epithelial / LPF RARE  RARE   WBC, UA 7-10  <3 WBC/hpf   RBC / HPF 0-2  <3 RBC/hpf   Bacteria, UA MANY (*) RARE   Urine-Other AMORPHOUS URATES/PHOSPHATES      Imaging Studies: Dg Chest 2 View  02/18/2013   CLINICAL DATA:  Midsternal chest pain since last night.  EXAM: CHEST  2 VIEW  COMPARISON:  Chest x-ray 09/14/2012.  FINDINGS: Lung volumes are normal. No consolidative airspace disease. No pleural effusions. No pneumothorax. No pulmonary nodule or mass noted. Pulmonary vasculature and the cardiomediastinal silhouette are within normal limits.  IMPRESSION: 1.  No radiographic evidence of acute cardiopulmonary disease.   Electronically Signed   By: Trudie Reed M.D.   On: 02/18/2013 22:38    EKG Interpretation     Ventricular Rate:  79 PR Interval:  148 QRS Duration: 78 QT Interval:  376 QTC Calculation: 431 R Axis:   67 Text Interpretation:  Normal sinus rhythm Normal ECG No previous ECGs available       I personally performed the services described in this documentation, which was scribed in my presence.  The recorded information has been reviewed and is accurate.   Hanley Seamen, MD 02/18/13 2352

## 2013-02-21 LAB — URINE CULTURE

## 2013-02-24 ENCOUNTER — Telehealth (HOSPITAL_COMMUNITY): Payer: Self-pay | Admitting: Emergency Medicine

## 2013-02-24 NOTE — ED Notes (Signed)
Post ED Visit - Positive Culture Follow-up  Culture report reviewed by antimicrobial stewardship pharmacist: []  Wes Dulaney, Pharm.D., BCPS [x]  Celedonio Miyamoto, Pharm.D., BCPS []  Georgina Pillion, Pharm.D., BCPS []  [place], [place].D., BCPS, AAHIVP []  Estella Husk, Pharm.D., BCPS, AAHIVP  Positive urine culture Treated with Macrobid, organism sensitive to the same and no further patient follow-up is required at this time.  Kylie A Holland 02/24/2013, 8:11 AM

## 2015-05-06 ENCOUNTER — Encounter (HOSPITAL_BASED_OUTPATIENT_CLINIC_OR_DEPARTMENT_OTHER): Payer: Self-pay

## 2015-05-06 ENCOUNTER — Emergency Department (HOSPITAL_BASED_OUTPATIENT_CLINIC_OR_DEPARTMENT_OTHER)
Admission: EM | Admit: 2015-05-06 | Discharge: 2015-05-06 | Disposition: A | Discharge status: Home / Self Care | Payer: Medicaid Other | Attending: Emergency Medicine | Admitting: Emergency Medicine

## 2015-05-06 DIAGNOSIS — R0981 Nasal congestion: Secondary | ICD-10-CM | POA: Diagnosis present

## 2015-05-06 DIAGNOSIS — R52 Pain, unspecified: Secondary | ICD-10-CM | POA: Diagnosis not present

## 2015-05-06 DIAGNOSIS — Z3202 Encounter for pregnancy test, result negative: Secondary | ICD-10-CM | POA: Diagnosis not present

## 2015-05-06 DIAGNOSIS — R509 Fever, unspecified: Secondary | ICD-10-CM | POA: Insufficient documentation

## 2015-05-06 DIAGNOSIS — F17210 Nicotine dependence, cigarettes, uncomplicated: Secondary | ICD-10-CM | POA: Diagnosis not present

## 2015-05-06 DIAGNOSIS — N938 Other specified abnormal uterine and vaginal bleeding: Secondary | ICD-10-CM | POA: Insufficient documentation

## 2015-05-06 DIAGNOSIS — J029 Acute pharyngitis, unspecified: Secondary | ICD-10-CM | POA: Insufficient documentation

## 2015-05-06 LAB — URINALYSIS, ROUTINE W REFLEX MICROSCOPIC
Bilirubin Urine: NEGATIVE
Glucose, UA: NEGATIVE mg/dL
Ketones, ur: 15 mg/dL — AB
NITRITE: NEGATIVE
PROTEIN: 30 mg/dL — AB
Specific Gravity, Urine: 1.026 (ref 1.005–1.030)
pH: 7.5 (ref 5.0–8.0)

## 2015-05-06 LAB — URINE MICROSCOPIC-ADD ON

## 2015-05-06 LAB — PREGNANCY, URINE: PREG TEST UR: NEGATIVE

## 2015-05-06 LAB — RAPID STREP SCREEN (MED CTR MEBANE ONLY): STREPTOCOCCUS, GROUP A SCREEN (DIRECT): NEGATIVE

## 2015-05-06 MED ORDER — IBUPROFEN 800 MG PO TABS
ORAL_TABLET | ORAL | Status: AC
  Filled 2015-05-06: qty 1

## 2015-05-06 MED ORDER — SULFAMETHOXAZOLE-TRIMETHOPRIM 800-160 MG PO TABS: 1.0000 | ORAL_TABLET | Freq: Two times a day (BID) | ORAL | Status: AC

## 2015-05-06 MED ORDER — IBUPROFEN 800 MG PO TABS
800.0000 mg | ORAL_TABLET | Freq: Once | ORAL | Status: AC
  Administered 2015-05-06: 800 mg via ORAL

## 2015-05-06 NOTE — ED Notes (Signed)
Patient here with body aches, sore throat, fever x 1 day. Has not taken fever medication. No distress

## 2015-05-06 NOTE — ED Notes (Signed)
MD at bedside. 

## 2015-05-06 NOTE — ED Provider Notes (Signed)
CSN: [id number]     Arrival date & time 05/06/15  1352 History  By signing my name below, I, Elizabeth Duffy, attest that this documentation has been prepared under the direction and in the presence of Blane Ohara, MD. Electronically Signed: Bethel Duffy, ED Scribe. 05/06/2015. 4:16 PM   Chief Complaint  Patient presents with  . Generalized Body Aches  . Nasal Congestion    The history is provided by the patient. No language interpreter was used.   Elizabeth Duffy is a 30 y.o. female who presents to the Emergency Department complaining of new, constant, 10/10 in severity at worst, generalized myalgias with onset last night. Pt states that last night after a bath she noted aching from head to toe. She took an OTC pain reliever with some improvement last night but woke up with the same pain.   Associated symptoms include sore throat, cough, fever, notes that she is currently menstruating). Pt denies abnormal vaginal discharge and no new sexual partners. She last had sexual intercourse 4 months ago. Pt is also concerned because this is the first menstrual period that she has had since October 2016 and the blood appears dark purple.    History reviewed. No pertinent past medical history. History reviewed. No pertinent past surgical history. No family history on file. Social History  Substance Use Topics  . Smoking status: Current Every Day Smoker -- 0.50 packs/day    Types: Cigarettes  . Smokeless tobacco: None  . Alcohol Use: Yes     Comment: occasional    OB History    No data available     Review of Systems  Constitutional: Positive for fever.  HENT: Positive for sore throat.   Respiratory: Positive for cough.   Gastrointestinal: Positive for abdominal pain.  Genitourinary: Positive for vaginal bleeding (menstruating) and menstrual problem. Negative for vaginal discharge.  All other systems reviewed and are negative.   Allergies  Review of patient's allergies  indicates no known allergies.  Home Medications   Prior to Admission medications   Medication Sig Start Date End Date Taking? Authorizing Provider  sulfamethoxazole-trimethoprim (BACTRIM DS,SEPTRA DS) 800-160 MG tablet Take 1 tablet by mouth 2 (two) times daily. 05/06/15 05/13/15  Blane Ohara, MD   BP 132/69 mmHg  Pulse 102  Temp(Src) 100.3 F (37.9 C) (Oral)  Resp 20  Ht  (1.575 m)  Wt 145 lb (65.772 kg)  BMI 26.51 kg/m2  SpO2 96% Physical Exam  Constitutional: She is oriented to person, place, and time. She appears well-developed and well-nourished.  HENT:  Head: Normocephalic.  Mouth/Throat: Posterior oropharyngeal erythema present. No oropharyngeal exudate or posterior oropharyngeal edema.  Eyes: EOM are normal.  Neck: Normal range of motion.  Cardiovascular: Normal rate.   Pulmonary/Chest: Effort normal.  CTAB  Abdominal: She exhibits no distension. There is no tenderness.  No focal abdominal tenderness on exam  Musculoskeletal: Normal range of motion.  Diffuse tenderness at the musculature of the back.   Neurological: She is alert and oriented to person, place, and time.  Psychiatric: She has a normal mood and affect.  Nursing note and vitals reviewed.   ED Course  Procedures (including critical care time) DIAGNOSTIC STUDIES: Oxygen Saturation is 96% on RA,  normal by my interpretation.    COORDINATION OF CARE: 3:57 PM Discussed treatment plan which includes lab work and ibuprofen with pt at bedside and pt agreed to plan.  Labs Review Labs Reviewed  URINALYSIS, ROUTINE W REFLEX MICROSCOPIC (NOT AT [facility]) -  Abnormal; Notable for the following:    APPearance CLOUDY (*)    Hgb urine dipstick SMALL (*)    Ketones, ur 15 (*)    Protein, ur 30 (*)    Leukocytes, UA TRACE (*)    All other components within normal limits  URINE MICROSCOPIC-ADD ON - Abnormal; Notable for the following:    Squamous Epithelial / LPF 6-30 (*)    Bacteria, UA MANY (*)    All other  components within normal limits  RAPID STREP SCREEN (NOT AT [facility])  CULTURE, GROUP A STREP [facility])  URINE CULTURE  PREGNANCY, URINE    Imaging Review No results found. I have personally reviewed and evaluated these lab results as part of my medical decision-making.   EKG Interpretation None      MDM   Final diagnoses:  Body aches  Fever, unspecified fever cause   Patient presents with bodyaches, sore throat clinically concern for viral however patient has mild back pain in early pyelonephritis on the differential. Urinalysis any bacteria however also many squamous. Plan to start oral antibiotics and patient will follow-up culture result to see if she needs to continue.  Results and differential diagnosis were discussed with the patient/parent/guardian. Xrays were independently reviewed by myself.  Close follow up outpatient was discussed, comfortable with the plan.   Medications  ibuprofen (ADVIL,MOTRIN) tablet 800 mg (800 mg Oral Given 05/06/15 1407)    Filed Vitals:   05/06/15 1404 05/06/15 1534  BP: 132/69 116/67  Pulse: 102 93  Temp: 100.3 F (37.9 C) 99.7 F (37.6 C)  TempSrc: Oral Oral  Resp: 20 18  Height:  (1.575 m)   Weight: 145 lb (65.772 kg)   SpO2: 96% 98%    Final diagnoses:  Body aches  Fever, unspecified fever cause      Blane Ohara, MD 05/06/15 [phone]

## 2015-05-06 NOTE — Discharge Instructions (Signed)
If you were given medicines take as directed.  If you are on coumadin or contraceptives realize their levels and effectiveness is altered by many different medicines.  If you have any reaction (rash, tongues swelling, other) to the medicines stop taking and see a physician.   Continue tylenol every 4 hrs and motrin every 6 hrs.   It is important to follow-up urine culture result because if it is negative stop your antibiotics. If your blood pressure was elevated in the ER make sure you follow up for management with a primary doctor or return for chest pain, shortness of breath or stroke symptoms.  Please follow up as directed and return to the ER or see a physician for new or worsening symptoms.  Thank you. Filed Vitals:   05/06/15 1404 05/06/15 1534  BP: 132/69 116/67  Pulse: 102 93  Temp: 100.3 F (37.9 C) 99.7 F (37.6 C)  TempSrc: Oral Oral  Resp: 20 18  Height:  (1.575 m)   Weight: 145 lb (65.772 kg)   SpO2: 96% 98%

## 2015-05-08 LAB — URINE CULTURE

## 2015-05-09 LAB — CULTURE, GROUP A STREP (THRC)

## 2015-08-30 ENCOUNTER — Emergency Department (HOSPITAL_BASED_OUTPATIENT_CLINIC_OR_DEPARTMENT_OTHER)
Admission: EM | Admit: 2015-08-30 | Discharge: 2015-08-30 | Disposition: A | Discharge status: Home / Self Care | Payer: Medicaid Other | Attending: Emergency Medicine | Admitting: Emergency Medicine

## 2015-08-30 ENCOUNTER — Encounter (HOSPITAL_BASED_OUTPATIENT_CLINIC_OR_DEPARTMENT_OTHER): Payer: Self-pay

## 2015-08-30 DIAGNOSIS — N3000 Acute cystitis without hematuria: Secondary | ICD-10-CM | POA: Insufficient documentation

## 2015-08-30 DIAGNOSIS — N898 Other specified noninflammatory disorders of vagina: Secondary | ICD-10-CM | POA: Diagnosis present

## 2015-08-30 DIAGNOSIS — F17210 Nicotine dependence, cigarettes, uncomplicated: Secondary | ICD-10-CM | POA: Diagnosis not present

## 2015-08-30 DIAGNOSIS — A5901 Trichomonal vulvovaginitis: Secondary | ICD-10-CM | POA: Diagnosis not present

## 2015-08-30 HISTORY — DX: Headache, unspecified: R51.9

## 2015-08-30 HISTORY — DX: Unspecified ovarian cyst, unspecified side: N83.209

## 2015-08-30 HISTORY — DX: Headache: R51

## 2015-08-30 LAB — URINALYSIS, ROUTINE W REFLEX MICROSCOPIC
BILIRUBIN URINE: NEGATIVE
GLUCOSE, UA: NEGATIVE mg/dL
KETONES UR: 15 mg/dL — AB
NITRITE: NEGATIVE
PH: 6.5 (ref 5.0–8.0)
Protein, ur: 30 mg/dL — AB
SPECIFIC GRAVITY, URINE: 1.017 (ref 1.005–1.030)

## 2015-08-30 LAB — WET PREP, GENITAL
Clue Cells Wet Prep HPF POC: NONE SEEN
SPERM: NONE SEEN
YEAST WET PREP: NONE SEEN

## 2015-08-30 LAB — URINE MICROSCOPIC-ADD ON

## 2015-08-30 LAB — PREGNANCY, URINE: Preg Test, Ur: NEGATIVE

## 2015-08-30 MED ORDER — AZITHROMYCIN 250 MG PO TABS
1000.0000 mg | ORAL_TABLET | Freq: Once | ORAL | Status: AC
Start: 2015-08-30 — End: 2015-08-30
  Administered 2015-08-30: 1000 mg via ORAL
  Filled 2015-08-30: qty 4

## 2015-08-30 MED ORDER — HYDROCODONE-ACETAMINOPHEN 5-325 MG PO TABS
2.0000 | ORAL_TABLET | Freq: Once | ORAL | Status: AC
  Administered 2015-08-30: 2 via ORAL
  Filled 2015-08-30: qty 2

## 2015-08-30 MED ORDER — CEFTRIAXONE SODIUM 1 G IJ SOLR
1.0000 g | Freq: Once | INTRAMUSCULAR | Status: AC
  Administered 2015-08-30: 1 g via INTRAMUSCULAR
  Filled 2015-08-30: qty 10

## 2015-08-30 MED ORDER — CEPHALEXIN 500 MG PO CAPS: 500.0000 mg | ORAL_CAPSULE | Freq: Four times a day (QID) | ORAL | Status: DC

## 2015-08-30 MED ORDER — METRONIDAZOLE 500 MG PO TABS: 500.0000 mg | ORAL_TABLET | Freq: Two times a day (BID) | ORAL | Status: DC

## 2015-08-30 MED ORDER — HYDROCODONE-ACETAMINOPHEN 5-325 MG PO TABS: 1.0000 | ORAL_TABLET | Freq: Four times a day (QID) | ORAL | Status: DC | PRN

## 2015-08-30 NOTE — ED Notes (Signed)
MD at bedside. 

## 2015-08-30 NOTE — ED Notes (Signed)
C/o pain to entire back, vaginal pain, HA started 5am-pt tearful and presents to triage in w/c

## 2015-08-30 NOTE — Discharge Instructions (Signed)
Keflex and Flagyl as prescribed.  Hydrocodone as prescribed as needed for pain.  We will call you if your cultures indicate you require further treatment.  Return to the ER symptoms significantly worsen or change.   Urinary Tract Infection Urinary tract infections (UTIs) can develop anywhere along your urinary tract. Your urinary tract is your body's drainage system for removing wastes and extra water. Your urinary tract includes two kidneys, two ureters, a bladder, and a urethra. Your kidneys are a pair of bean-shaped organs. Each kidney is about the size of your fist. They are located below your ribs, one on each side of your spine. CAUSES Infections are caused by microbes, which are microscopic organisms, including fungi, viruses, and bacteria. These organisms are so small that they can only be seen through a microscope. Bacteria are the microbes that most commonly cause UTIs. SYMPTOMS  Symptoms of UTIs may vary by age and gender of the patient and by the location of the infection. Symptoms in young women typically include a frequent and intense urge to urinate and a painful, burning feeling in the bladder or urethra during urination. Older women and men are more likely to be tired, shaky, and weak and have muscle aches and abdominal pain. A fever may mean the infection is in your kidneys. Other symptoms of a kidney infection include pain in your back or sides below the ribs, nausea, and vomiting. DIAGNOSIS To diagnose a UTI, your caregiver will ask you about your symptoms. Your caregiver will also ask you to provide a urine sample. The urine sample will be tested for bacteria and white blood cells. White blood cells are made by your body to help fight infection. TREATMENT  Typically, UTIs can be treated with medication. Because most UTIs are caused by a bacterial infection, they usually can be treated with the use of antibiotics. The choice of antibiotic and length of treatment depend on your  symptoms and the type of bacteria causing your infection. HOME CARE INSTRUCTIONS  If you were prescribed antibiotics, take them exactly as your caregiver instructs you. Finish the medication even if you feel better after you have only taken some of the medication.  Drink enough water and fluids to keep your urine clear or pale yellow.  Avoid caffeine, tea, and carbonated beverages. They tend to irritate your bladder.  Empty your bladder often. Avoid holding urine for long periods of time.  Empty your bladder before and after sexual intercourse.  After a bowel movement, women should cleanse from front to back. Use each tissue only once. SEEK MEDICAL CARE IF:   You have back pain.  You develop a fever.  Your symptoms do not begin to resolve within 3 days. SEEK IMMEDIATE MEDICAL CARE IF:   You have severe back pain or lower abdominal pain.  You develop chills.  You have nausea or vomiting.  You have continued burning or discomfort with urination. MAKE SURE YOU:   Understand these instructions.  Will watch your condition.  Will get help right away if you are not doing well or get worse.   This information is not intended to replace advice given to you by your health care provider. Make sure you discuss any questions you have with your health care provider.   Document Released: 01/09/2005 Document Revised: 12/21/2014 Document Reviewed: 05/10/2011 Elsevier Interactive Patient Education 2016 [employer].  Trichomoniasis Trichomoniasis is an infection caused by an organism called Trichomonas. The infection can affect both women and men. In women, the  outer female genitalia and the vagina are affected. In men, the penis is mainly affected, but the prostate and other reproductive organs can also be involved. Trichomoniasis is a sexually transmitted infection (STI) and is most often passed to another person through sexual contact.  RISK FACTORS  Having unprotected sexual  intercourse.  Having sexual intercourse with an infected partner. SIGNS AND SYMPTOMS  Symptoms of trichomoniasis in women include:  Abnormal gray-green frothy vaginal discharge.  Itching and irritation of the vagina.  Itching and irritation of the area outside the vagina. Symptoms of trichomoniasis in men include:   Penile discharge with or without pain.  Pain during urination. This results from inflammation of the urethra. DIAGNOSIS  Trichomoniasis may be found during a Pap test or physical exam. Your health care provider may use one of the following methods to help diagnose this infection:  Testing the pH of the vagina with a test tape.  Using a vaginal swab test that checks for the Trichomonas organism. A test is available that provides results within a few minutes.  Examining a urine sample.  Testing vaginal secretions. Your health care provider may test you for other STIs, including HIV. TREATMENT   You may be given medicine to fight the infection. Women should inform their health care provider if they could be or are pregnant. Some medicines used to treat the infection should not be taken during pregnancy.  Your health care provider may recommend over-the-counter medicines or creams to decrease itching or irritation.  Your sexual partner will need to be treated if infected.  Your health care provider may test you for infection again 3 months after treatment. HOME CARE INSTRUCTIONS   Take medicines only as directed by your health care provider.  Take over-the-counter medicine for itching or irritation as directed by your health care provider.  Do not have sexual intercourse while you have the infection.  Women should not douche or wear tampons while they have the infection.  Discuss your infection with your partner. Your partner may have gotten the infection from you, or you may have gotten it from your partner.  Have your sex partner get examined and treated if  necessary.  Practice safe, informed, and protected sex.  See your health care provider for other STI testing. SEEK MEDICAL CARE IF:   You still have symptoms after you finish your medicine.  You develop abdominal pain.  You have pain when you urinate.  You have bleeding after sexual intercourse.  You develop a rash.  Your medicine makes you sick or makes you throw up (vomit). MAKE SURE YOU:  Understand these instructions.  Will watch your condition.  Will get help right away if you are not doing well or get worse.   This information is not intended to replace advice given to you by your health care provider. Make sure you discuss any questions you have with your health care provider.   Document Released: 09/25/2000 Document Revised: 04/22/2014 Document Reviewed: 01/11/2013 Elsevier Interactive Patient Education [employer] Elsevier Inc.

## 2015-08-30 NOTE — ED Provider Notes (Signed)
CSN: [id number]     Arrival date & time 08/30/15  1433 History   First MD Initiated Contact with Patient 08/30/15 1501     Chief Complaint  Patient presents with  . Vaginal Discharge     (Consider location/radiation/quality/duration/timing/severity/associated sxs/prior Treatment) HPI Comments: Patient is a 30 year old female with no significant past medical history. She presents for evaluation of vaginal discharge and lower abdominal discomfort. This is been going on for the past 2 days. She denies any fevers or chills. She denies any urinary complaints she is currently menstruating. She denies any new sexual contacts.  Patient is a 30 y.o. female presenting with vaginal discharge. The history is provided by the patient.  Vaginal Discharge Quality:  White Severity:  Moderate Onset quality:  Gradual Timing:  Constant Progression:  Worsening Chronicity:  New Relieved by:  Nothing Worsened by:  Nothing tried Ineffective treatments:  None tried   Past Medical History  Diagnosis Date  . Ovarian cyst   . HA (headache)    History reviewed. No pertinent past surgical history. No family history on file. Social History  Substance Use Topics  . Smoking status: Current Every Day Smoker -- 0.50 packs/day    Types: Cigarettes  . Smokeless tobacco: None  . Alcohol Use: Yes     Comment: occasional    OB History    No data available     Review of Systems  Genitourinary: Positive for vaginal discharge.  All other systems reviewed and are negative.     Allergies  Review of patient's allergies indicates no known allergies.  Home Medications   Prior to Admission medications   Not on File   BP 113/69 mmHg  Pulse 92  Temp(Src) 99 F (37.2 C) (Oral)  Resp 20  Ht 5\' 2"  (1.575 m)  Wt 140 lb (63.504 kg)  BMI 25.60 kg/m2  SpO2 100%  LMP 08/28/2015 Physical Exam  Constitutional: She is oriented to person, place, and time. She appears well-developed and well-nourished. No  distress.  HENT:  Head: Normocephalic and atraumatic.  Neck: Normal range of motion. Neck supple.  Cardiovascular: Normal rate and regular rhythm.  Exam reveals no gallop and no friction rub.   No murmur heard. Pulmonary/Chest: Effort normal and breath sounds normal. No respiratory distress. She has no wheezes.  Abdominal: Soft. Bowel sounds are normal. She exhibits no distension. There is tenderness. There is no rebound and no guarding.  There is tenderness to palpation in the suprapubic region.  Musculoskeletal: Normal range of motion.  Neurological: She is alert and oriented to person, place, and time.  Skin: Skin is warm and dry. She is not diaphoretic.  Nursing note and vitals reviewed.   ED Course  Procedures (including critical care time) Labs Review Labs Reviewed  URINALYSIS, ROUTINE W REFLEX MICROSCOPIC (NOT AT [facility]) - Abnormal; Notable for the following:    APPearance CLOUDY (*)    Hgb urine dipstick LARGE (*)    Ketones, ur 15 (*)    Protein, ur 30 (*)    Leukocytes, UA LARGE (*)    All other components within normal limits  URINE MICROSCOPIC-ADD ON - Abnormal; Notable for the following:    Squamous Epithelial / LPF 6-30 (*)    Bacteria, UA FEW (*)    All other components within normal limits  WET PREP, GENITAL  PREGNANCY, URINE  GC/CHLAMYDIA PROBE AMP ([place]) NOT AT [facility]    Imaging Review No results found. I have personally reviewed and evaluated these  images and lab results as part of my medical decision-making.    MDM   Final diagnoses:  None    Urinalysis reveals a UTI. She will be given Rocephin and Keflex. Her wet prep also shows many WBCs and trichomonas. She will be given Flagyl.    Geoffery Lyons, MD 08/30/15 [phone]

## 2015-08-31 LAB — GC/CHLAMYDIA PROBE AMP (~~LOC~~) NOT AT ARMC
Chlamydia: NEGATIVE
NEISSERIA GONORRHEA: NEGATIVE

## 2016-01-23 ENCOUNTER — Emergency Department (HOSPITAL_BASED_OUTPATIENT_CLINIC_OR_DEPARTMENT_OTHER): Payer: Medicaid Other

## 2016-01-23 ENCOUNTER — Emergency Department (HOSPITAL_BASED_OUTPATIENT_CLINIC_OR_DEPARTMENT_OTHER)
Admission: EM | Admit: 2016-01-23 | Discharge: 2016-01-23 | Disposition: A | Discharge status: Home / Self Care | Payer: Medicaid Other | Attending: Emergency Medicine | Admitting: Emergency Medicine

## 2016-01-23 ENCOUNTER — Encounter (HOSPITAL_BASED_OUTPATIENT_CLINIC_OR_DEPARTMENT_OTHER): Payer: Self-pay | Admitting: *Deleted

## 2016-01-23 DIAGNOSIS — R1030 Lower abdominal pain, unspecified: Secondary | ICD-10-CM

## 2016-01-23 DIAGNOSIS — N939 Abnormal uterine and vaginal bleeding, unspecified: Secondary | ICD-10-CM | POA: Insufficient documentation

## 2016-01-23 DIAGNOSIS — F17210 Nicotine dependence, cigarettes, uncomplicated: Secondary | ICD-10-CM | POA: Diagnosis not present

## 2016-01-23 LAB — BASIC METABOLIC PANEL
Anion gap: 7 (ref 5–15)
BUN: 11 mg/dL (ref 6–20)
CHLORIDE: 105 mmol/L (ref 101–111)
CO2: 23 mmol/L (ref 22–32)
CREATININE: 0.71 mg/dL (ref 0.44–1.00)
Calcium: 9.3 mg/dL (ref 8.9–10.3)
GFR calc non Af Amer: >60 mL/min (ref >60)
Glucose, Bld: 85 mg/dL (ref 65–99)
POTASSIUM: 3.7 mmol/L (ref 3.5–5.1)
SODIUM: 135 mmol/L (ref 135–145)

## 2016-01-23 LAB — CBC WITH DIFFERENTIAL/PLATELET
Basophils Absolute: 0 10*3/uL (ref 0.0–0.1)
Basophils Relative: 1 %
Eosinophils Absolute: 0.2 10*3/uL (ref 0.0–0.7)
Eosinophils Relative: 4 %
HEMATOCRIT: 37.8 % (ref 36.0–46.0)
HEMOGLOBIN: 13.2 g/dL (ref 12.0–15.0)
LYMPHS ABS: 1.5 10*3/uL (ref 0.7–4.0)
LYMPHS PCT: 41 %
MCH: 31.2 pg (ref 26.0–34.0)
MCHC: 34.9 g/dL (ref 30.0–36.0)
MCV: 89.4 fL (ref 78.0–100.0)
MONOS PCT: 9 %
Monocytes Absolute: 0.3 10*3/uL (ref 0.1–1.0)
NEUTROS ABS: 1.7 10*3/uL (ref 1.7–7.7)
NEUTROS PCT: 45 %
Platelets: 291 10*3/uL (ref 150–400)
RBC: 4.23 MIL/uL (ref 3.87–5.11)
RDW: 12.7 % (ref 11.5–15.5)
WBC: 3.8 10*3/uL — AB (ref 4.0–10.5)

## 2016-01-23 LAB — URINALYSIS, ROUTINE W REFLEX MICROSCOPIC
GLUCOSE, UA: NEGATIVE mg/dL
KETONES UR: 15 mg/dL — AB
NITRITE: NEGATIVE
PH: 6 (ref 5.0–8.0)
Protein, ur: 30 mg/dL — AB
SPECIFIC GRAVITY, URINE: 1.024 (ref 1.005–1.030)

## 2016-01-23 LAB — WET PREP, GENITAL
Clue Cells Wet Prep HPF POC: NONE SEEN
SPERM: NONE SEEN
Trich, Wet Prep: NONE SEEN
YEAST WET PREP: NONE SEEN

## 2016-01-23 LAB — URINE MICROSCOPIC-ADD ON

## 2016-01-23 LAB — PREGNANCY, URINE: Preg Test, Ur: NEGATIVE

## 2016-01-23 MED ORDER — NAPROXEN 500 MG PO TABS: 500.0000 mg | ORAL_TABLET | Freq: Two times a day (BID) | ORAL | 0 refills | Status: DC

## 2016-01-23 MED ORDER — NORETHINDRONE ACETATE 5 MG PO TABS: 5.0000 mg | ORAL_TABLET | Freq: Every day | ORAL | 0 refills | Status: DC

## 2016-01-23 MED ORDER — SODIUM CHLORIDE 0.9 % IV BOLUS (SEPSIS)
1000.0000 mL | Freq: Once | INTRAVENOUS | Status: AC
  Administered 2016-01-23: 1000 mL via INTRAVENOUS

## 2016-01-23 NOTE — Discharge Instructions (Signed)
Take Naproxen and norethindrone as directed. You may experience a withdrawal bleed with 3-7 days after completing the norethindrone.  Please follow up with your OBGYN at the next available appointment in the next 2 weeks.  Return to ER for new or worsening symptoms, any additional concerns.
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
## 2016-01-24 LAB — GC/CHLAMYDIA PROBE AMP (~~LOC~~) NOT AT ARMC
Chlamydia: NEGATIVE
Neisseria Gonorrhea: NEGATIVE

## 2016-02-28 ENCOUNTER — Encounter (HOSPITAL_BASED_OUTPATIENT_CLINIC_OR_DEPARTMENT_OTHER): Payer: Self-pay

## 2016-02-28 ENCOUNTER — Emergency Department (HOSPITAL_BASED_OUTPATIENT_CLINIC_OR_DEPARTMENT_OTHER)
Admission: EM | Admit: 2016-02-28 | Discharge: 2016-02-28 | Disposition: A | Discharge status: Home / Self Care | Payer: Medicaid Other | Attending: Physician Assistant | Admitting: Physician Assistant

## 2016-02-28 DIAGNOSIS — M791 Myalgia, unspecified site: Secondary | ICD-10-CM

## 2016-02-28 DIAGNOSIS — Y92481 Parking lot as the place of occurrence of the external cause: Secondary | ICD-10-CM | POA: Diagnosis not present

## 2016-02-28 DIAGNOSIS — M542 Cervicalgia: Secondary | ICD-10-CM | POA: Insufficient documentation

## 2016-02-28 DIAGNOSIS — Y9389 Activity, other specified: Secondary | ICD-10-CM | POA: Diagnosis not present

## 2016-02-28 DIAGNOSIS — F17210 Nicotine dependence, cigarettes, uncomplicated: Secondary | ICD-10-CM | POA: Diagnosis not present

## 2016-02-28 DIAGNOSIS — M546 Pain in thoracic spine: Secondary | ICD-10-CM | POA: Diagnosis not present

## 2016-02-28 DIAGNOSIS — R51 Headache: Secondary | ICD-10-CM | POA: Insufficient documentation

## 2016-02-28 DIAGNOSIS — S0990XA Unspecified injury of head, initial encounter: Secondary | ICD-10-CM | POA: Diagnosis present

## 2016-02-28 DIAGNOSIS — Y999 Unspecified external cause status: Secondary | ICD-10-CM | POA: Insufficient documentation

## 2016-02-28 MED ORDER — IBUPROFEN 800 MG PO TABS
800.0000 mg | ORAL_TABLET | Freq: Once | ORAL | Status: AC
  Administered 2016-02-28: 800 mg via ORAL
  Filled 2016-02-28: qty 1

## 2016-02-28 NOTE — ED Notes (Signed)
Pt verbalized understanding of discharge instructions and denies any further questions at this time.   

## 2016-02-28 NOTE — Discharge Instructions (Signed)
Your soreness will likely continue for the next several days but should progressively start improving. Take the prescribed medication from [place] to help with your symptoms. Follow-up with your primary care doctor. Return here for new concerns.
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
## 2016-02-28 NOTE — ED Notes (Signed)
ED Provider at bedside. 
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
## 2016-10-02 ENCOUNTER — Encounter (HOSPITAL_BASED_OUTPATIENT_CLINIC_OR_DEPARTMENT_OTHER): Payer: Self-pay | Admitting: *Deleted

## 2016-10-02 ENCOUNTER — Emergency Department (HOSPITAL_BASED_OUTPATIENT_CLINIC_OR_DEPARTMENT_OTHER)
Admission: EM | Admit: 2016-10-02 | Discharge: 2016-10-02 | Disposition: A | Discharge status: Home / Self Care | Payer: Medicaid Other | Attending: Emergency Medicine | Admitting: Emergency Medicine

## 2016-10-02 DIAGNOSIS — F17210 Nicotine dependence, cigarettes, uncomplicated: Secondary | ICD-10-CM | POA: Insufficient documentation

## 2016-10-02 DIAGNOSIS — M5431 Sciatica, right side: Secondary | ICD-10-CM | POA: Diagnosis not present

## 2016-10-02 DIAGNOSIS — I10 Essential (primary) hypertension: Secondary | ICD-10-CM | POA: Insufficient documentation

## 2016-10-02 DIAGNOSIS — M79604 Pain in right leg: Secondary | ICD-10-CM | POA: Diagnosis present

## 2016-10-02 MED ORDER — HYDROCODONE-ACETAMINOPHEN 5-325 MG PO TABS
2.0000 | ORAL_TABLET | Freq: Once | ORAL | Status: AC
  Administered 2016-10-02: 2 via ORAL
  Filled 2016-10-02: qty 2

## 2016-10-02 MED ORDER — HYDROCODONE-ACETAMINOPHEN 5-325 MG PO TABS: 1.0000 | ORAL_TABLET | ORAL | 0 refills | Status: DC | PRN

## 2016-10-02 NOTE — ED Triage Notes (Signed)
Pt with radiating  right leg pain x 1 week progressively worsening over the last week.

## 2016-10-02 NOTE — ED Provider Notes (Addendum)
[facility] Provider Note: Elizabeth Duffy Elizabeth Eduardo, MD, FACEP  CSN: [id number] MRN: [id number] ARRIVAL: 10/02/16 at 0138 ROOM: MH01/MH01   CHIEF COMPLAINT  Leg Pain   HISTORY OF PRESENT ILLNESS  Elizabeth Duffy is a 31 y.o. female with a 1-1/2 week history of pain. She denies trauma. The pain originates in her right buttock and radiates down the back of her right leg and around the anterior right thigh and about the L3 or L4 dermatome. She rates the pain as a 10 out of 10. It is a sharp pain. It is worse with movement or attempted weightbearing. She has had difficulty ambulating due to the pain on weightbearing. She is having some mild paresthesias of the anterior right lower leg. She has had no bowel or bladder changes. She was seen by her physician recently was started on ibuprofen. Despite taking ibuprofen the pain has worsened.   [place] [place] [facility] [place] reveals the patient has received no opioid prescriptions in the past year.   Past Medical History:  Diagnosis Date  . HA (headache)   . Hypertension   . Ovarian cyst     History reviewed. No pertinent surgical history.  History reviewed. No pertinent family history.  Social History  Substance Use Topics  . Smoking status: Current Every Day Smoker    Packs/day: 0.50    Types: Cigarettes  . Smokeless tobacco: Never Used  . Alcohol use Yes     Comment: occasional     Prior to Admission medications   Not on File    Allergies Patient has no known allergies.   REVIEW OF SYSTEMS  Negative except as noted here or in the History of Present Illness.   PHYSICAL EXAMINATION  Initial Vital Signs Blood pressure 120/73, pulse 81, temperature 98.5 F (36.9 C), temperature source Oral, resp. rate (!) 24, height 5\' 2"  (1.575 m), weight 62.6 kg (138 lb), last menstrual period 09/19/2016, SpO2 100 %.  Examination General: Well-developed, well-nourished female in no  acute distress; appearance consistent with age of record HENT: normocephalic; atraumatic Eyes: pupils equal, round and reactive to light; extraocular muscles intact Neck: supple Heart: regular rate and rhythm Lungs: clear to auscultation bilaterally Abdomen: soft; nondistended; nontender; bowel sounds present Back: No lumbar tenderness; positive straight leg raise on the right at about 10 Extremities: No deformity; full range of motion except right hip due to pain; pulses normal Neurologic: Awake, alert and oriented; motor function intact in all extremities and symmetric; sensation intact in lower extremities except for mild decreased sensation in proximal anterior right lower leg; no facial droop Skin: Warm and dry Psychiatric: Flat affect   RESULTS  Summary of this visit's results, reviewed by myself:   EKG Interpretation  Date/Time:    Ventricular Rate:    PR Interval:    QRS Duration:   QT Interval:    QTC Calculation:   R Axis:     Text Interpretation:        Laboratory Studies: No results found for this or any previous visit (from the past 24 hour(s)). Imaging Studies: No results found.  ED COURSE  Nursing notes and initial vitals signs, including pulse oximetry, reviewed.  Vitals:   10/02/16 0147 10/02/16 0148  BP:  120/73  Pulse:  81  Resp:  (!) 24  Temp:  98.5 F (36.9 C)  TempSrc:  Oral  SpO2:  100%  Weight: 62.6 kg (138 lb)   Height: 5\' 2"  (1.575  m)     PROCEDURES    ED DIAGNOSES     ICD-10-CM   1. Sciatica of right side M54.31        Alayla Dethlefs, MD 10/02/16 0251    Paula Libra, MD 10/02/16 [phone]

## 2017-05-13 ENCOUNTER — Encounter (HOSPITAL_BASED_OUTPATIENT_CLINIC_OR_DEPARTMENT_OTHER): Payer: Self-pay | Admitting: *Deleted

## 2017-05-13 ENCOUNTER — Other Ambulatory Visit: Payer: Self-pay

## 2017-05-13 DIAGNOSIS — R05 Cough: Secondary | ICD-10-CM | POA: Diagnosis present

## 2017-05-13 DIAGNOSIS — I10 Essential (primary) hypertension: Secondary | ICD-10-CM | POA: Insufficient documentation

## 2017-05-13 DIAGNOSIS — J069 Acute upper respiratory infection, unspecified: Secondary | ICD-10-CM | POA: Insufficient documentation

## 2017-05-13 DIAGNOSIS — B9789 Other viral agents as the cause of diseases classified elsewhere: Secondary | ICD-10-CM | POA: Insufficient documentation

## 2017-05-13 DIAGNOSIS — F17210 Nicotine dependence, cigarettes, uncomplicated: Secondary | ICD-10-CM | POA: Insufficient documentation

## 2017-05-13 NOTE — ED Triage Notes (Addendum)
Pt reports URI x 3-4 days. Cough, nasal congestion, chills. Nonproductive cough

## 2017-05-14 ENCOUNTER — Emergency Department (HOSPITAL_BASED_OUTPATIENT_CLINIC_OR_DEPARTMENT_OTHER)
Admission: EM | Admit: 2017-05-14 | Discharge: 2017-05-14 | Disposition: A | Discharge status: Home / Self Care | Payer: Medicaid Other | Attending: Emergency Medicine | Admitting: Emergency Medicine

## 2017-05-14 ENCOUNTER — Emergency Department (HOSPITAL_BASED_OUTPATIENT_CLINIC_OR_DEPARTMENT_OTHER): Payer: Medicaid Other

## 2017-05-14 DIAGNOSIS — J069 Acute upper respiratory infection, unspecified: Secondary | ICD-10-CM

## 2017-05-14 DIAGNOSIS — B9789 Other viral agents as the cause of diseases classified elsewhere: Secondary | ICD-10-CM

## 2017-05-14 MED ORDER — IBUPROFEN 600 MG PO TABS: 600.0000 mg | ORAL_TABLET | Freq: Four times a day (QID) | ORAL | 0 refills | Status: AC | PRN

## 2017-05-14 NOTE — Discharge Instructions (Signed)
You were seen today for shortness of breath, cough, chills.  You likely have a viral illness.  Follow-up with your primary physician if symptoms worsen.  In the meantime take ibuprofen as needed for any fevers or body aches.

## 2017-05-14 NOTE — ED Provider Notes (Signed)
[facility] EMERGENCY DEPARTMENT Provider Note   CSN: [id number] Arrival date & time: 05/13/17  2317     History   Chief Complaint Chief Complaint  Patient presents with  . URI    HPI Elizabeth Duffy Floor is a 32 y.o. female.  HPI  This is a 32 year old female with a history of hypertension, headache who presents with shortness of breath, cough, chills.  Patient reports onset of symptoms on Saturday.  No known sick contacts.  She reports waking up sweating multiple nights.  She has not taken her temperature.  She states that she had some shortness of breath and a dry cough.  She had self limited chest pain on Saturday that was worse with coughing.  She has not had any since.  She reports congestion and headache.  No neck pain.  No known sick contacts.  Past Medical History:  Diagnosis Date  . HA (headache)   . Hypertension   . Ovarian cyst     There are no active problems to display for this patient.   History reviewed. No pertinent surgical history.  OB History    No data available       Home Medications    Prior to Admission medications   Medication Sig Start Date End Date Taking? Authorizing Provider  ibuprofen (ADVIL,MOTRIN) 600 MG tablet Take 1 tablet (600 mg total) by mouth every 6 (six) hours as needed. 05/14/17   Fayetta Sorenson, Mayer Maskerourtney F, MD    Family History History reviewed. No pertinent family history.  Social History Social History   Tobacco Use  . Smoking status: Current Every Day Smoker    Packs/day: 0.50    Types: Cigarettes  . Smokeless tobacco: Never Used  Substance Use Topics  . Alcohol use: Yes    Comment: occasional   . Drug use: No     Allergies   Patient has no known allergies.   Review of Systems Review of Systems  Constitutional: Positive for chills and diaphoresis. Negative for fever.  HENT: Positive for congestion. Negative for sore throat.   Respiratory: Positive for cough and shortness of breath.     Cardiovascular: Positive for chest pain.  Gastrointestinal: Positive for nausea. Negative for abdominal distention, diarrhea and vomiting.  Genitourinary: Negative for dysuria.  All other systems reviewed and are negative.    Physical Exam Updated Vital Signs BP 120/78 (BP Location: Left Arm)   Pulse 74   Temp 98.4 F (36.9 C) (Oral)   Resp 18   Ht 5\' 2"  (1.575 m)   Wt 63.5 kg (140 lb)   LMP 05/07/2017   SpO2 100%   BMI 25.61 kg/m   Physical Exam  Constitutional: She is oriented to person, place, and time. She appears well-developed and well-nourished.  HENT:  Head: Normocephalic and atraumatic.  Uvula midline, oropharynx moist and clear, no exudate noted  Eyes: Pupils are equal, round, and reactive to light.  Neck: Normal range of motion.  Cardiovascular: Normal rate, regular rhythm and normal heart sounds.  No murmur heard. Pulmonary/Chest: Effort normal and breath sounds normal. No respiratory distress. She has no wheezes.  Abdominal: Soft. Bowel sounds are normal. There is no tenderness. There is no guarding.  Neurological: She is alert and oriented to person, place, and time.  Skin: Skin is warm and dry.  Psychiatric: She has a normal mood and affect.  Nursing note and vitals reviewed.    ED Treatments / Results  Labs (all labs ordered are listed, but  only abnormal results are displayed) Labs Reviewed - No data to display  EKG  EKG Interpretation None       Radiology Dg Chest 2 View  Result Date: 05/14/2017 CLINICAL DATA:  Nonproductive cough, nasal congestion, and chills for 4 days. Smoker. EXAM: CHEST  2 VIEW COMPARISON:  02/18/2013 FINDINGS: The heart size and mediastinal contours are within normal limits. Both lungs are clear. The visualized skeletal structures are unremarkable. IMPRESSION: No active cardiopulmonary disease. Electronically Signed   By: Burman Nieves M.D.   On: 05/14/2017 01:14    Procedures Procedures (including critical care  time)  Medications Ordered in ED Medications - No data to display   Initial Impression / Assessment and Plan / ED Course  I have reviewed the triage vital signs and the nursing notes.  Pertinent labs & imaging results that were available during my care of the patient were reviewed by me and considered in my medical decision making (see chart for details).     Presents with upper respiratory symptoms, cough, myalgia.  She is nontoxic on exam.  Vital signs reassuring.  She did have some chest pain shortness of breath.  While pulmonary exam is reassuring, chest x-ray obtained to rule out pneumonia.  This is negative.  EKG shows no evidence of ischemia.  She is satting 100% and given viral symptoms, doubt PE.  Recommend supportive measures.  After history, exam, and medical workup I feel the patient has been appropriately medically screened and is safe for discharge home. Pertinent diagnoses were discussed with the patient. Patient was given return precautions.   Final Clinical Impressions(s) / ED Diagnoses   Final diagnoses:  Viral URI with cough    ED Discharge Orders        Ordered    ibuprofen (ADVIL,MOTRIN) 600 MG tablet  Every 6 hours PRN     05/14/17 0121       Shon Baton, MD 05/14/17 [phone]

## 2017-10-05 IMAGING — US US ART/VEN ABD/PELV/SCROTUM DOPPLER LTD
1 series · 13 of 25 positions shown · non-contrast
Comparison: None.

CLINICAL DATA: Heavy vaginal bleeding off and on for 4 months

EXAM:
TRANSABDOMINAL AND TRANSVAGINAL ULTRASOUND OF PELVIS
DOPPLER ULTRASOUND OF OVARIES
TECHNIQUE: Both transabdominal and transvaginal ultrasound examinations of the
pelvis were performed. Transabdominal technique was performed for
global imaging of the pelvis including uterus, ovaries, adnexal
regions, and pelvic cul-de-sac.
It was necessary to proceed with endovaginal exam following the
transabdominal exam to visualize the uterus, endometrium, ovaries
and adnexa . Color and duplex Doppler ultrasound was utilized to
evaluate blood flow to the ovaries.

[Series 1: us art/ven abd/pelv/scrotum doppler ltd · 0.20mm/px · 13 of 81 slices shown]
[im 1/81]
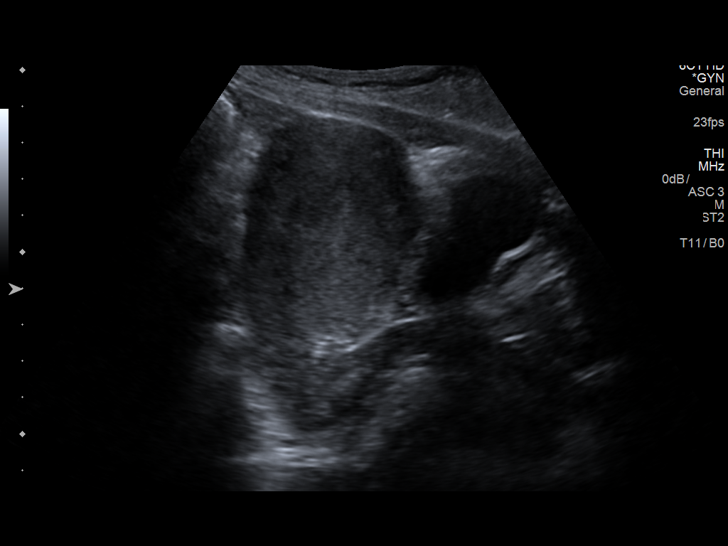
[im 7/81]
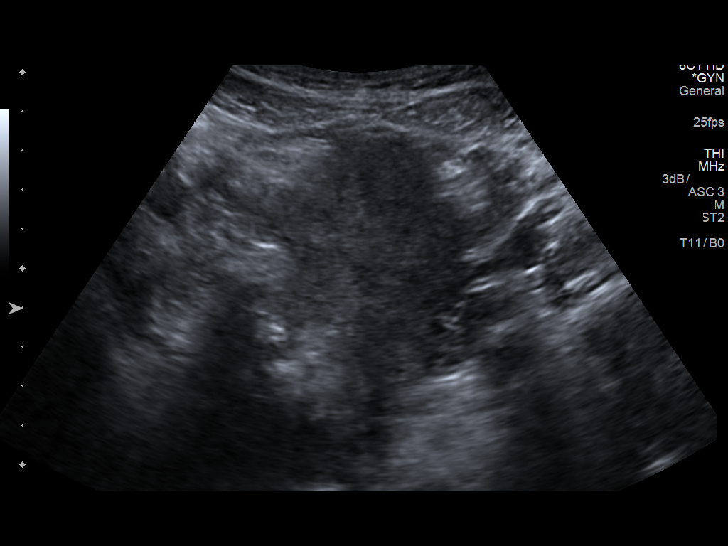
[im 14/81]
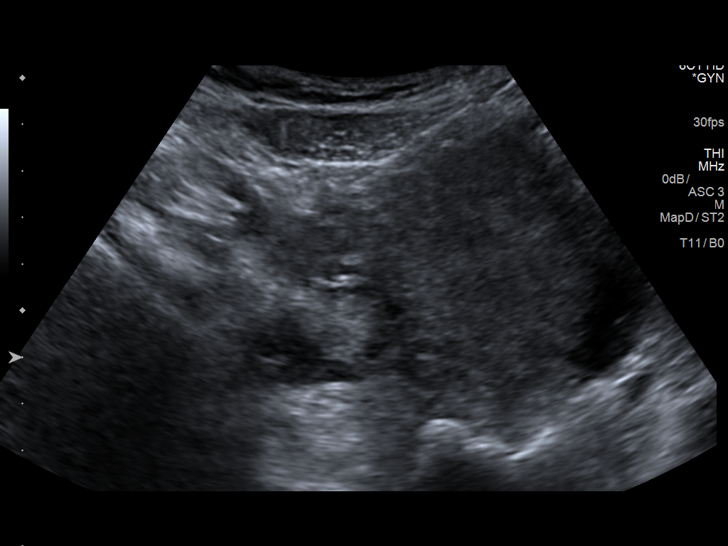
[im 21/81]
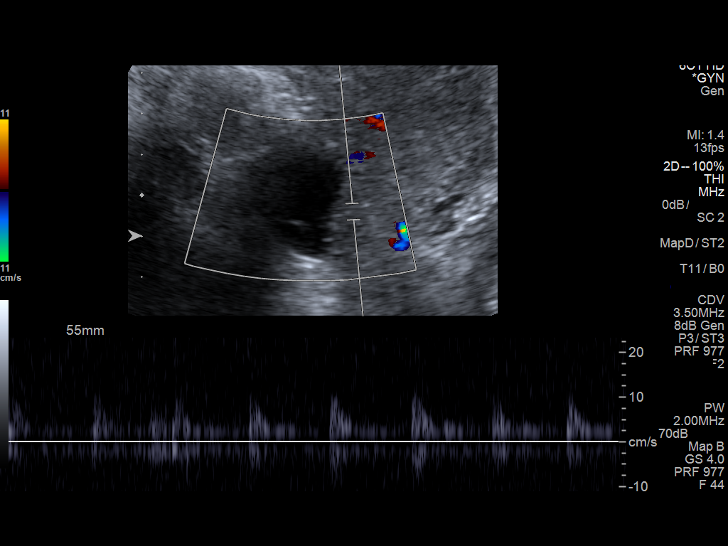
[im 27/81]
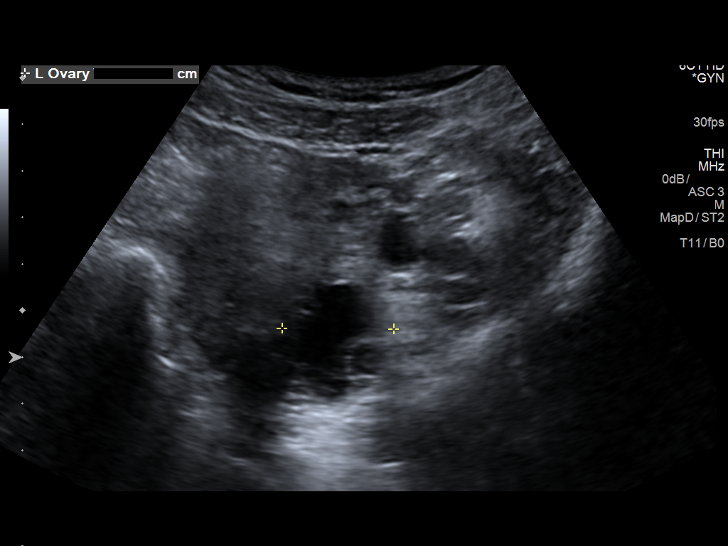
[im 34/81]
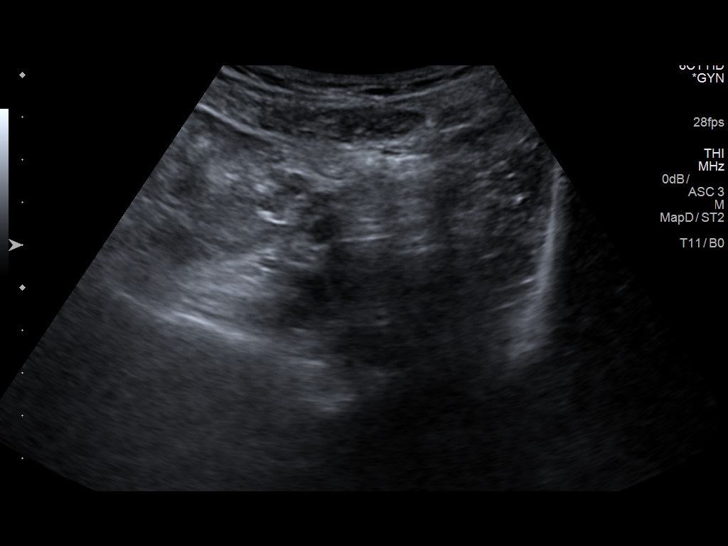
[im 41/81]
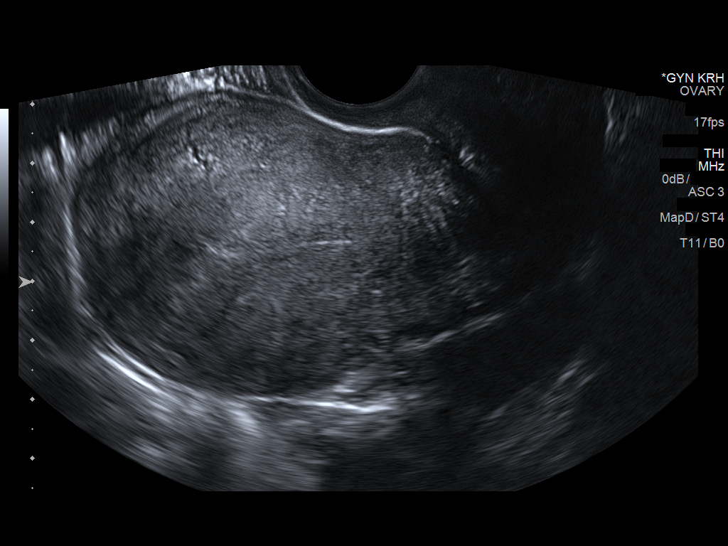
[im 47/81]
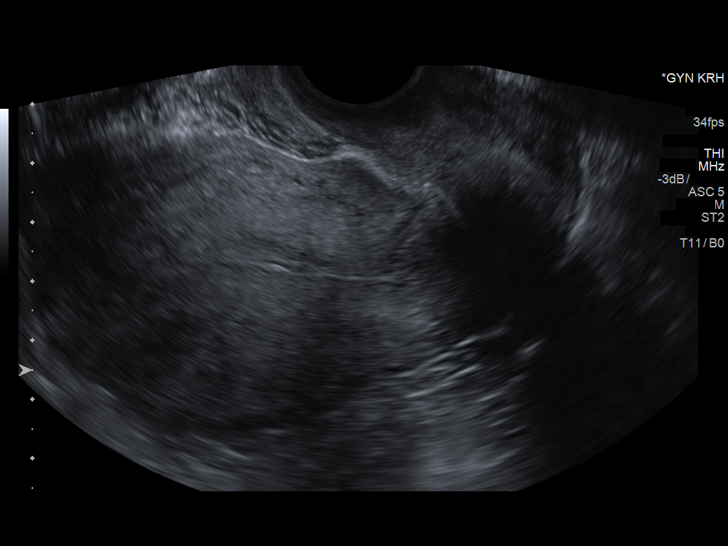
[im 54/81]
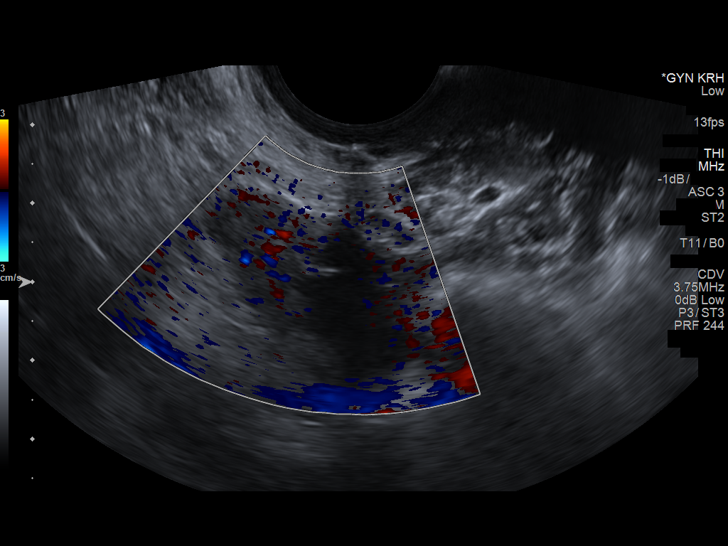
[im 61/81]
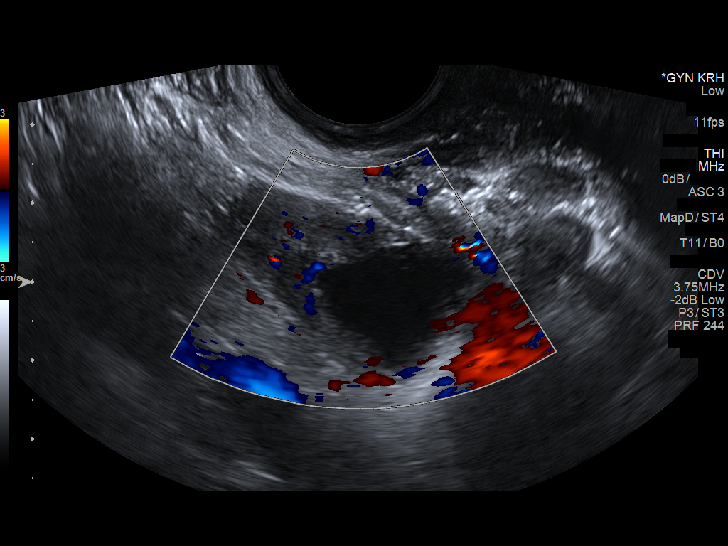
[im 67/81]
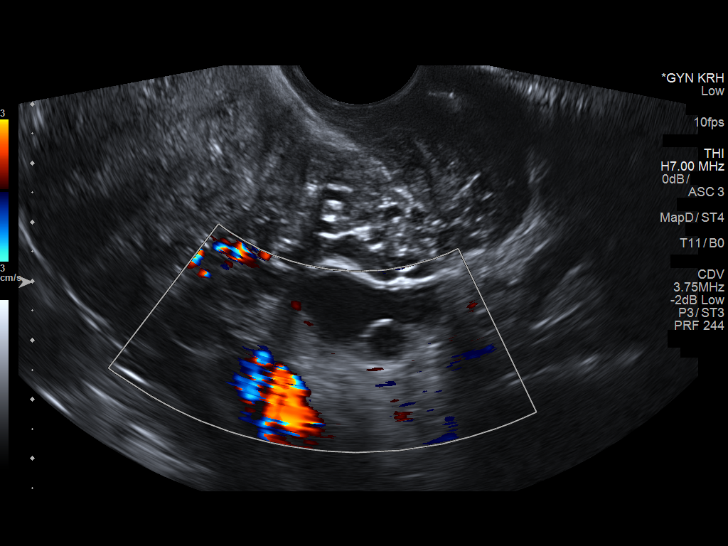
[im 74/81]
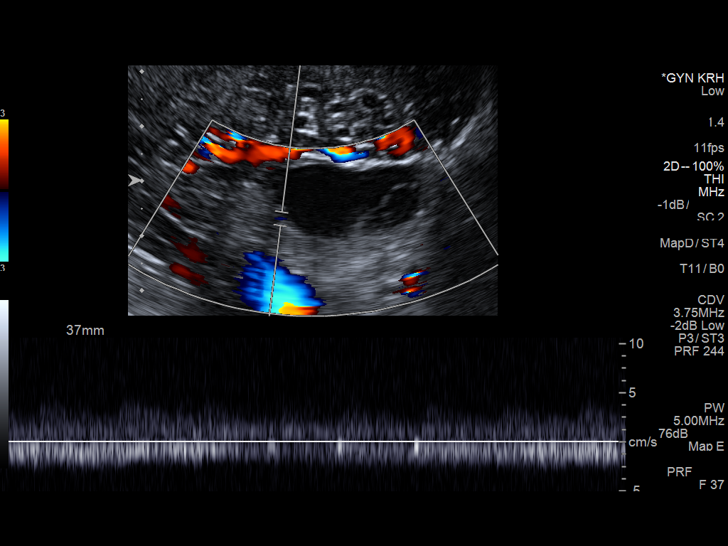
[im 81/81]
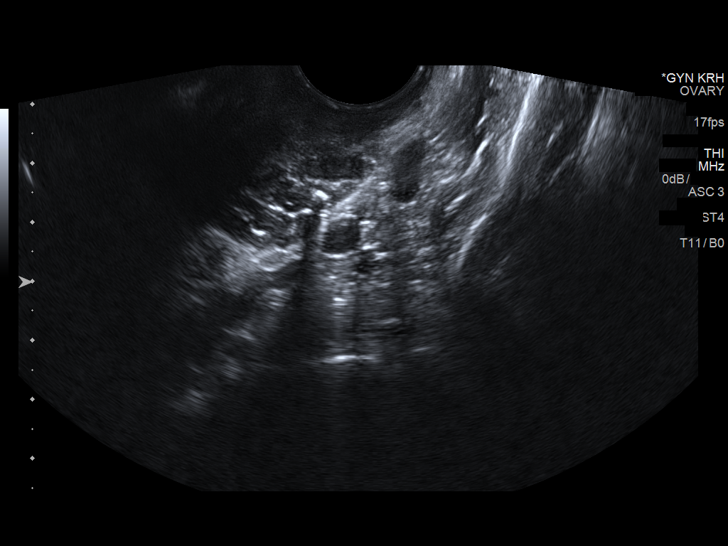

[13 of 25 positions shown; findings below may reference images not displayed]

FINDINGS: Uterus

Measurements: 10.0 x 5.1 x 5.4 cm. No fibroids or other mass
visualized.

Endometrium

Thickness: 6 mm in thickness.  No focal abnormality visualized.

Right ovary

Measurements: 3.4 x 1.9 x 2.3 cm. Small collapsing cyst or follicle
measures 1.8 cm. Normal appearance/no adnexal mass.

Left ovary

Measurements: 4.6 x 1.9 x 2.7 cm. Small sister follicle measures up
to 2.7 cm. No adnexal masses.

Pulsed Doppler evaluation of both ovaries demonstrates normal
low-resistance arterial and venous waveforms.

Other findings

No free fluid
IMPRESSION: Unremarkable pelvic ultrasound.

## 2017-12-24 ENCOUNTER — Other Ambulatory Visit: Payer: Self-pay

## 2017-12-24 ENCOUNTER — Emergency Department (HOSPITAL_BASED_OUTPATIENT_CLINIC_OR_DEPARTMENT_OTHER)
Admission: EM | Admit: 2017-12-24 | Discharge: 2017-12-24 | Disposition: A | Discharge status: Home / Self Care | Payer: Medicaid Other | Attending: Emergency Medicine | Admitting: Emergency Medicine

## 2017-12-24 ENCOUNTER — Encounter (HOSPITAL_BASED_OUTPATIENT_CLINIC_OR_DEPARTMENT_OTHER): Payer: Self-pay | Admitting: *Deleted

## 2017-12-24 DIAGNOSIS — B351 Tinea unguium: Secondary | ICD-10-CM | POA: Insufficient documentation

## 2017-12-24 DIAGNOSIS — F17210 Nicotine dependence, cigarettes, uncomplicated: Secondary | ICD-10-CM | POA: Insufficient documentation

## 2017-12-24 DIAGNOSIS — I10 Essential (primary) hypertension: Secondary | ICD-10-CM | POA: Insufficient documentation

## 2017-12-24 NOTE — Discharge Instructions (Addendum)
Warm Epson salt water soaks for 20 minutes at a time. Follow-up with podiatry for further treatment.

## 2017-12-24 NOTE — ED Triage Notes (Signed)
Pt c/o right big toe pain and discoloration of nail x 5 days

## 2017-12-24 NOTE — ED Provider Notes (Signed)
[facility] EMERGENCY DEPARTMENT Provider Note   CSN: [id number] Arrival date & time: 12/24/17  1646     History   Chief Complaint Chief Complaint  Patient presents with  . Toe Pain    HPI Elizabeth Duffy is a 32 y.o. female.  32 year old female presents with plaint of pain in her right great toenail.  She states that she has been getting her nails done for some time, normally has a gel pedicure however noticed pain in the toe several days ago.  Patient had the most removed and the nail salon told her her toe was not infected.  Patient reports ongoing pain so she removed her polish today and noticed that her toenail is thickened and lifting off the toe.  No drainage from the area, no redness.  No history of injury.  No other complaints or concerns.     Past Medical History:  Diagnosis Date  . HA (headache)   . Hypertension   . Ovarian cyst     There are no active problems to display for this patient.   History reviewed. No pertinent surgical history.   OB History   None      Home Medications    Prior to Admission medications   Medication Sig Start Date End Date Taking? Authorizing Provider  ibuprofen (ADVIL,MOTRIN) 600 MG tablet Take 1 tablet (600 mg total) by mouth every 6 (six) hours as needed. 05/14/17   Horton, Mayer Masker, MD    Family History History reviewed. No pertinent family history.  Social History Social History   Tobacco Use  . Smoking status: Current Every Day Smoker    Packs/day: 0.50    Types: Cigarettes  . Smokeless tobacco: Never Used  Substance Use Topics  . Alcohol use: Yes    Comment: occasional   . Drug use: No     Allergies   Patient has no known allergies.   Review of Systems Review of Systems  Constitutional: Negative for fever.  Musculoskeletal: Positive for myalgias. Negative for gait problem and joint swelling.  Skin: Negative for color change, rash and wound.  Allergic/Immunologic: Negative for  immunocompromised state.  Neurological: Negative for numbness.  All other systems reviewed and are negative.    Physical Exam Updated Vital Signs BP (!) 131/95   Pulse 91   Temp 98.2 F (36.8 C) (Oral)   Resp 16   Ht 5\' 2"  (1.575 m)   Wt 64.9 kg   LMP 11/15/2017   SpO2 100%   BMI 26.16 kg/m   Physical Exam  Constitutional: She is oriented to person, place, and time. She appears well-developed and well-nourished. No distress.  HENT:  Head: Normocephalic and atraumatic.  Cardiovascular: Intact distal pulses.  Pulmonary/Chest: Effort normal.  Musculoskeletal:       Feet:  Neurological: She is alert and oriented to person, place, and time.  Skin: Skin is warm and dry. She is not diaphoretic. No erythema.  Psychiatric: She has a normal mood and affect. Her behavior is normal.  Nursing note and vitals reviewed.    ED Treatments / Results  Labs (all labs ordered are listed, but only abnormal results are displayed) Labs Reviewed - No data to display  EKG None  Radiology No results found.  Procedures Procedures (including critical care time)  Medications Ordered in ED Medications - No data to display   Initial Impression / Assessment and Plan / ED Course  I have reviewed the triage vital signs and the nursing  notes.  Pertinent labs & imaging results that were available during my care of the patient were reviewed by me and considered in my medical decision making (see chart for details).  Clinical Course as of Dec 24 1732  Wed Dec 24, 2017  [id number] 32 year old female with likely fungal infection of her right great toenail.  Recommend follow-up with podiatry, recommend warm salt water soaks.   [LM]    Clinical Course User Index [LM] Jeannie Fend, PA-C    Final Clinical Impressions(s) / ED Diagnoses   Final diagnoses:  Fungal infection of nail    ED Discharge Orders    None       Alden Hipp 12/24/17 1734    Terrilee Files,  MD 12/25/17 1447

## 2018-06-09 IMAGING — DX DG CHEST 2V
2 series · 2 of 2 positions shown · non-contrast
Comparison: 02/18/2013

CLINICAL DATA: Nonproductive cough, nasal congestion, and chills
for 4 days. Smoker.

EXAM:
CHEST  2 VIEW

[chest pa]
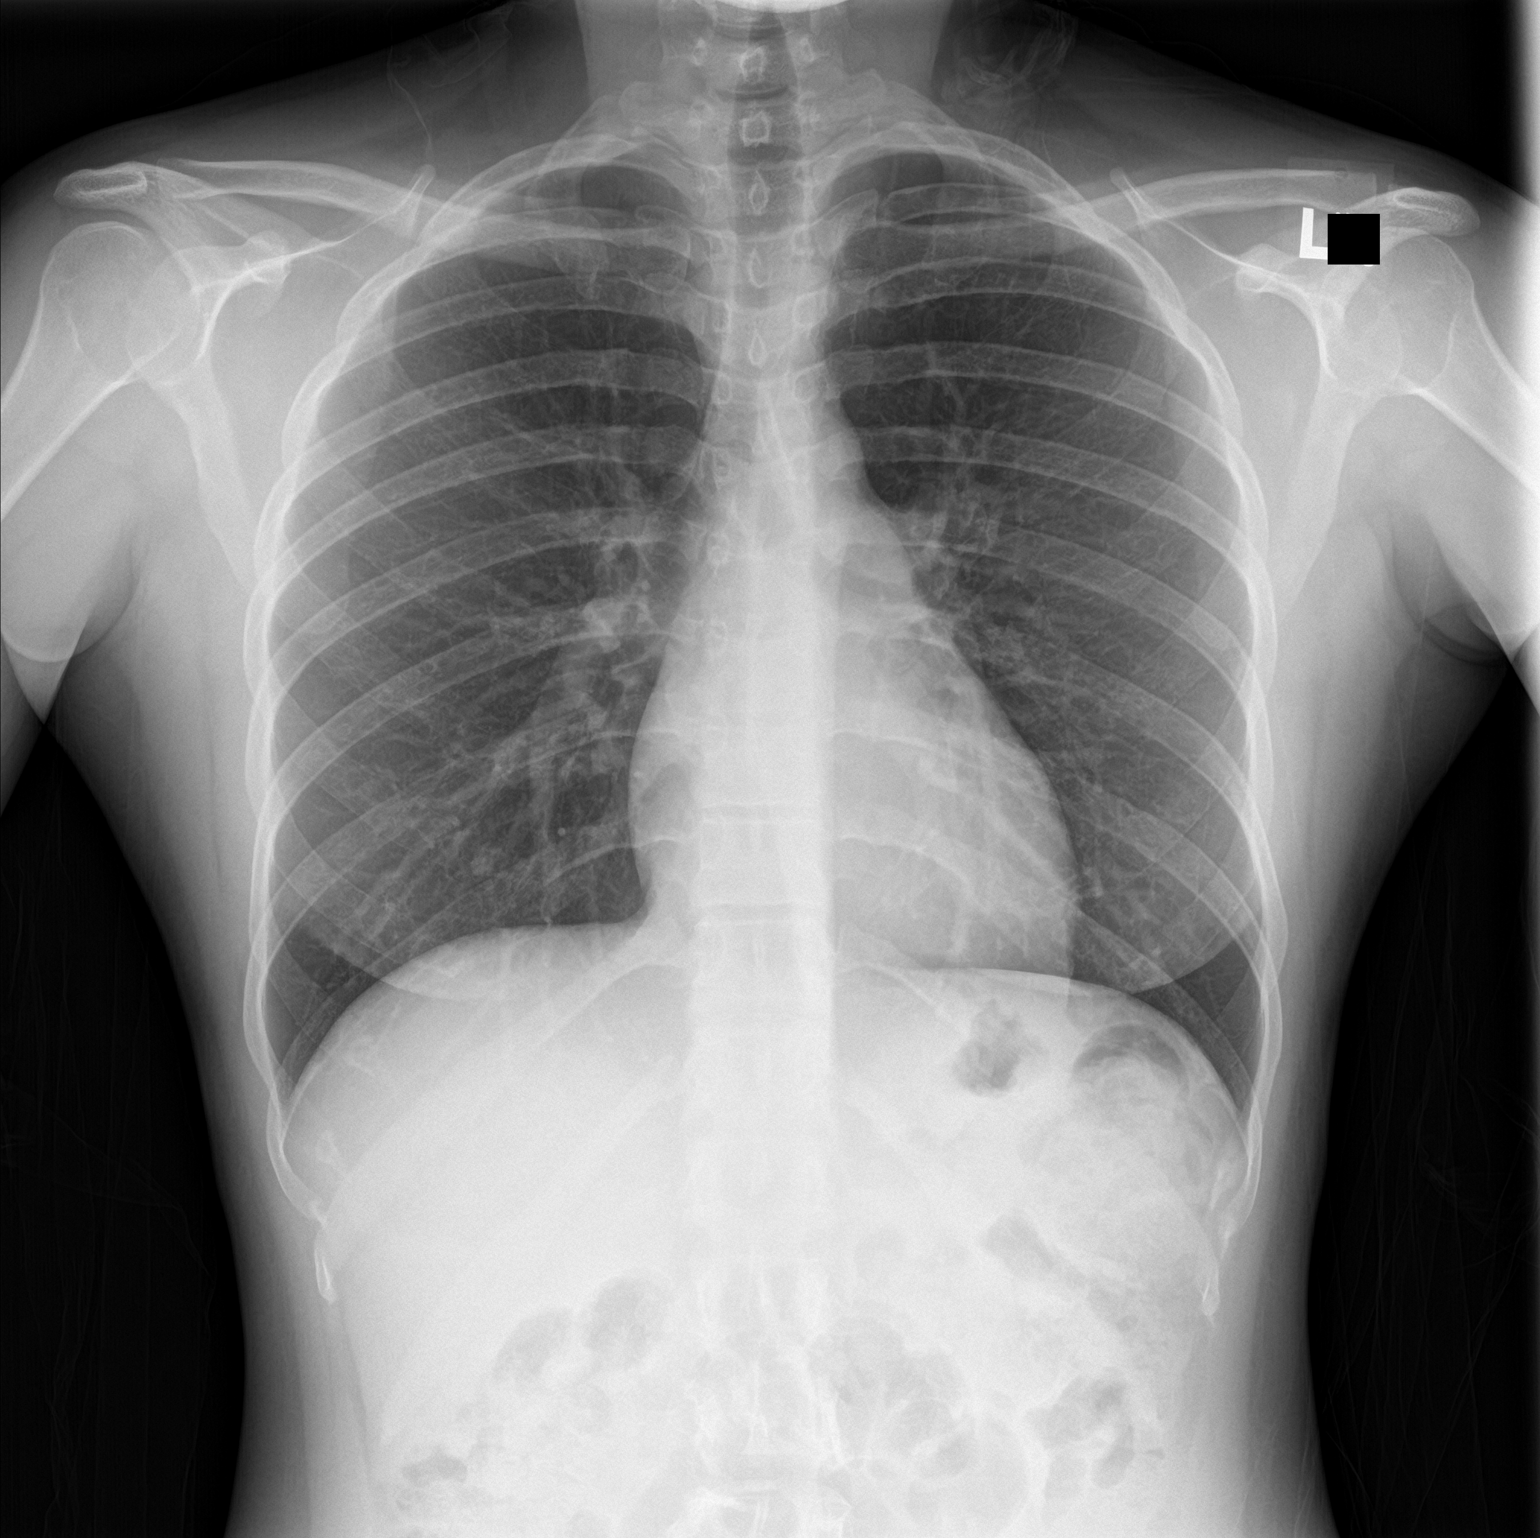

[chest lat]
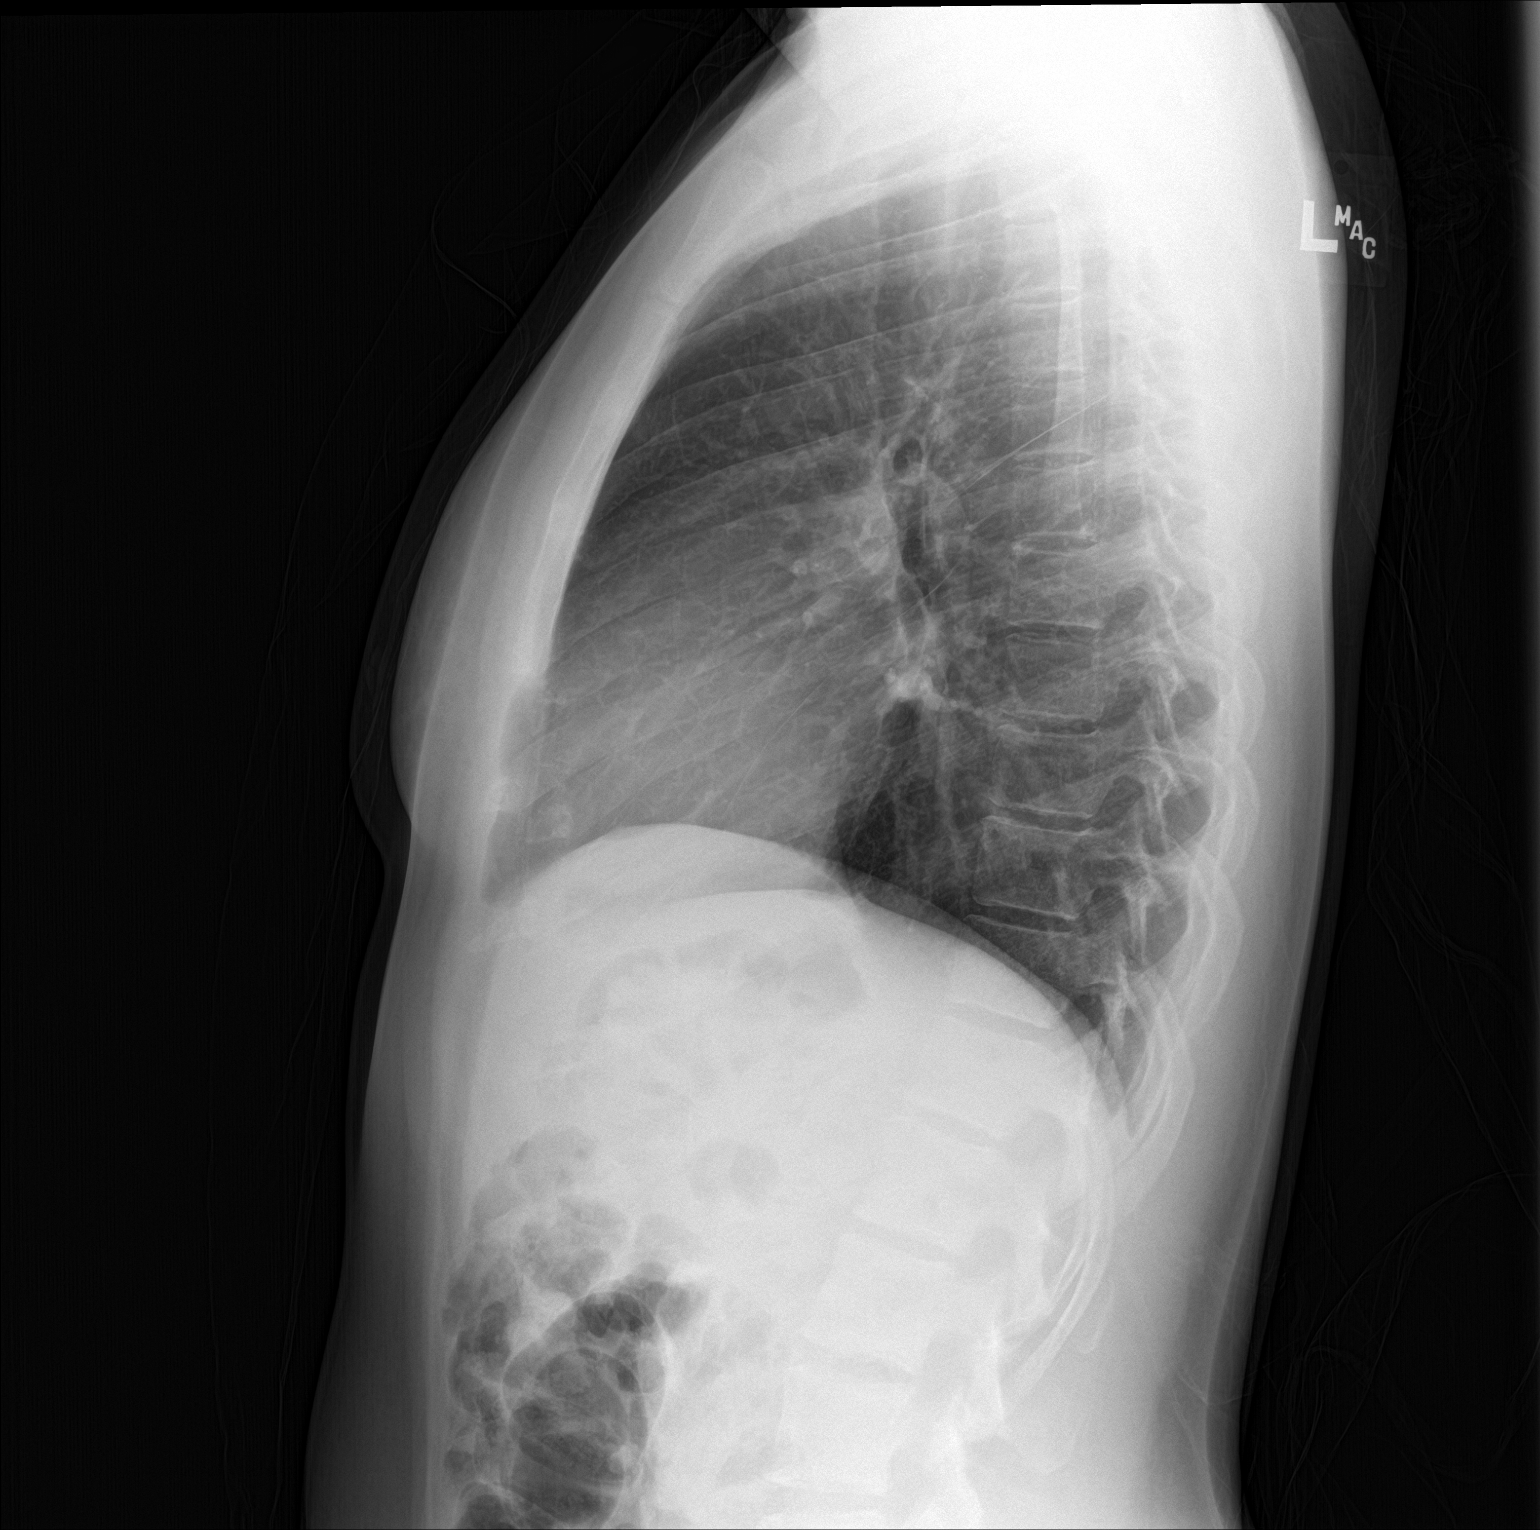

[2 of 2 positions shown; findings below may reference images not displayed]

FINDINGS: The heart size and mediastinal contours are within normal limits.
Both lungs are clear. The visualized skeletal structures are
unremarkable.
IMPRESSION: No active cardiopulmonary disease.

## 2019-01-11 ENCOUNTER — Ambulatory Visit: Payer: Self-pay | Admitting: Internal Medicine

## 2020-01-17 ENCOUNTER — Encounter (HOSPITAL_BASED_OUTPATIENT_CLINIC_OR_DEPARTMENT_OTHER): Payer: Self-pay | Admitting: Emergency Medicine

## 2020-01-17 ENCOUNTER — Emergency Department (HOSPITAL_BASED_OUTPATIENT_CLINIC_OR_DEPARTMENT_OTHER)
Admission: EM | Admit: 2020-01-17 | Discharge: 2020-01-17 | Disposition: A | Discharge status: Home / Self Care | Payer: Medicaid Other | Attending: Emergency Medicine | Admitting: Emergency Medicine

## 2020-01-17 ENCOUNTER — Other Ambulatory Visit: Payer: Self-pay

## 2020-01-17 DIAGNOSIS — R5383 Other fatigue: Secondary | ICD-10-CM | POA: Diagnosis not present

## 2020-01-17 DIAGNOSIS — I10 Essential (primary) hypertension: Secondary | ICD-10-CM | POA: Insufficient documentation

## 2020-01-17 DIAGNOSIS — Z20822 Contact with and (suspected) exposure to covid-19: Secondary | ICD-10-CM | POA: Insufficient documentation

## 2020-01-17 DIAGNOSIS — R519 Headache, unspecified: Secondary | ICD-10-CM | POA: Diagnosis present

## 2020-01-17 DIAGNOSIS — F17210 Nicotine dependence, cigarettes, uncomplicated: Secondary | ICD-10-CM | POA: Insufficient documentation

## 2020-01-17 DIAGNOSIS — H538 Other visual disturbances: Secondary | ICD-10-CM | POA: Diagnosis not present

## 2020-01-17 DIAGNOSIS — M791 Myalgia, unspecified site: Secondary | ICD-10-CM | POA: Diagnosis not present

## 2020-01-17 DIAGNOSIS — B349 Viral infection, unspecified: Secondary | ICD-10-CM

## 2020-01-17 DIAGNOSIS — R42 Dizziness and giddiness: Secondary | ICD-10-CM | POA: Insufficient documentation

## 2020-01-17 LAB — RESPIRATORY PANEL BY RT PCR (FLU A&B, COVID)
Influenza A by PCR: NEGATIVE
Influenza B by PCR: NEGATIVE
SARS Coronavirus 2 by RT PCR: NEGATIVE

## 2020-01-17 LAB — URINALYSIS, ROUTINE W REFLEX MICROSCOPIC
Bilirubin Urine: NEGATIVE
Glucose, UA: NEGATIVE mg/dL
Hgb urine dipstick: NEGATIVE
Ketones, ur: NEGATIVE mg/dL
Leukocytes,Ua: NEGATIVE
Nitrite: NEGATIVE
Protein, ur: NEGATIVE mg/dL
Specific Gravity, Urine: 1.02 (ref 1.005–1.030)
pH: 7 (ref 5.0–8.0)

## 2020-01-17 LAB — CBC WITH DIFFERENTIAL/PLATELET
Abs Immature Granulocytes: 0 10*3/uL (ref 0.00–0.07)
Basophils Absolute: 0.1 10*3/uL (ref 0.0–0.1)
Basophils Relative: 1 %
Eosinophils Absolute: 0.2 10*3/uL (ref 0.0–0.5)
Eosinophils Relative: 4 %
HCT: 33.9 % — ABNORMAL LOW (ref 36.0–46.0)
Hemoglobin: 10.9 g/dL — ABNORMAL LOW (ref 12.0–15.0)
Immature Granulocytes: 0 %
Lymphocytes Relative: 50 %
Lymphs Abs: 2.8 10*3/uL (ref 0.7–4.0)
MCH: 28.8 pg (ref 26.0–34.0)
MCHC: 32.2 g/dL (ref 30.0–36.0)
MCV: 89.4 fL (ref 80.0–100.0)
Monocytes Absolute: 0.4 10*3/uL (ref 0.1–1.0)
Monocytes Relative: 7 %
Neutro Abs: 2.1 10*3/uL (ref 1.7–7.7)
Neutrophils Relative %: 38 %
Platelets: 396 10*3/uL (ref 150–400)
RBC: 3.79 MIL/uL — ABNORMAL LOW (ref 3.87–5.11)
RDW: 14.7 % (ref 11.5–15.5)
WBC: 5.5 10*3/uL (ref 4.0–10.5)
nRBC: 0 % (ref 0.0–0.2)

## 2020-01-17 LAB — BASIC METABOLIC PANEL
Anion gap: 8 (ref 5–15)
BUN: 9 mg/dL (ref 6–20)
CO2: 26 mmol/L (ref 22–32)
Calcium: 9.2 mg/dL (ref 8.9–10.3)
Chloride: 103 mmol/L (ref 98–111)
Creatinine, Ser: 0.76 mg/dL (ref 0.44–1.00)
GFR calc Af Amer: >60 mL/min (ref >60)
GFR calc non Af Amer: >60 mL/min (ref >60)
Glucose, Bld: 91 mg/dL (ref 70–99)
Potassium: 3.5 mmol/L (ref 3.5–5.1)
Sodium: 137 mmol/L (ref 135–145)

## 2020-01-17 LAB — PREGNANCY, URINE: Preg Test, Ur: NEGATIVE

## 2020-01-17 MED ORDER — KETOROLAC TROMETHAMINE 15 MG/ML IJ SOLN
15.0000 mg | Freq: Once | INTRAMUSCULAR | Status: AC
  Administered 2020-01-17: 15 mg via INTRAVENOUS
  Filled 2020-01-17: qty 1

## 2020-01-17 MED ORDER — METOCLOPRAMIDE HCL 5 MG/ML IJ SOLN
10.0000 mg | Freq: Once | INTRAMUSCULAR | Status: AC
  Administered 2020-01-17: 10 mg via INTRAVENOUS
  Filled 2020-01-17: qty 2

## 2020-01-17 MED ORDER — SODIUM CHLORIDE 0.9 % IV BOLUS
500.0000 mL | Freq: Once | INTRAVENOUS | Status: AC
  Administered 2020-01-17: 500 mL via INTRAVENOUS

## 2020-01-17 NOTE — ED Triage Notes (Signed)
PT was at work and got a headache and general unwell feeling. Hx of HTN.

## 2020-01-17 NOTE — ED Provider Notes (Signed)
[facility] EMERGENCY DEPARTMENT Provider Note   CSN: [id number] Arrival date & time: 01/17/20  1215     History Chief Complaint  Patient presents with  . Headache  . Hypertension    Elizabeth Duffy is a 34 y.o. female presented emergency department with headache and generalized fatigue.  Patient reports that her symptoms began several days ago.  She went to work today and was feeling she was having a particularly worsening headache in the front of her headache.  She felt generally unwell.  She reports muscle aches all over.  She reports poor appetite.  She says she has had intermittent blurred vision associated with lightheadedness, but is not currently having the symptoms.  She has not received a Covid vaccines.  She denies any cough or shortness of breath.  HPI     Past Medical History:  Diagnosis Date  . HA (headache)   . Hypertension   . Ovarian cyst     There are no problems to display for this patient.   History reviewed. No pertinent surgical history.   OB History   No obstetric history on file.     History reviewed. No pertinent family history.  Social History   Tobacco Use  . Smoking status: Current Every Day Smoker    Packs/day: 0.50    Types: Cigarettes  . Smokeless tobacco: Never Used  Substance Use Topics  . Alcohol use: Yes    Comment: occasional   . Drug use: No    Home Medications Prior to Admission medications   Medication Sig Start Date End Date Taking? Authorizing Provider  ibuprofen (ADVIL,MOTRIN) 600 MG tablet Take 1 tablet (600 mg total) by mouth every 6 (six) hours as needed. 05/14/17   Horton, Mayer Masker, MD    Allergies    Patient has no known allergies.  Review of Systems   Review of Systems  Constitutional: Positive for fatigue and fever.  HENT: Positive for congestion. Negative for ear pain and sore throat.   Eyes: Negative for photophobia and visual disturbance.  Respiratory: Negative for cough and shortness  of breath.   Cardiovascular: Negative for chest pain and palpitations.  Gastrointestinal: Negative for abdominal pain and vomiting.  Genitourinary: Negative for dysuria and hematuria.  Musculoskeletal: Negative for arthralgias and back pain.  Skin: Negative for color change and rash.  Neurological: Positive for headaches. Negative for syncope.  Psychiatric/Behavioral: Negative for agitation and confusion.  All other systems reviewed and are negative.   Physical Exam Updated Vital Signs BP 111/75 (BP Location: Right Arm)   Pulse 64   Temp 98.1 F (36.7 C) (Oral)   Resp 16   SpO2 100%   Physical Exam Vitals and nursing note reviewed.  Constitutional:      General: She is not in acute distress.    Appearance: She is well-developed.  HENT:     Head: Normocephalic and atraumatic.  Eyes:     Conjunctiva/sclera: Conjunctivae normal.  Cardiovascular:     Rate and Rhythm: Normal rate and regular rhythm.     Heart sounds: Normal heart sounds.  Pulmonary:     Effort: Pulmonary effort is normal. No respiratory distress.     Breath sounds: Normal breath sounds.  Abdominal:     Palpations: Abdomen is soft.     Tenderness: There is no abdominal tenderness.  Musculoskeletal:     Cervical back: Normal range of motion and neck supple. No rigidity.  Skin:    General: Skin is warm  and dry.  Neurological:     Mental Status: She is alert.     GCS: GCS eye subscore is 4. GCS verbal subscore is 5. GCS motor subscore is 6.     Cranial Nerves: No cranial nerve deficit or dysarthria.     Sensory: No sensory deficit.     Motor: No weakness.  Psychiatric:        Mood and Affect: Mood normal.        Behavior: Behavior normal.     ED Results / Procedures / Treatments   Labs (all labs ordered are listed, but only abnormal results are displayed) Labs Reviewed  RESPIRATORY PANEL BY RT PCR (FLU A&B, COVID)  BASIC METABOLIC PANEL  CBC WITH DIFFERENTIAL/PLATELET  URINALYSIS, ROUTINE W REFLEX  MICROSCOPIC  PREGNANCY, URINE    EKG None  Radiology No results found.  Procedures Procedures (including critical care time)  Medications Ordered in ED Medications  sodium chloride 0.9 % bolus 500 mL (has no administration in time range)  metoCLOPramide (REGLAN) injection 10 mg (has no administration in time range)  ketorolac (TORADOL) 15 MG/ML injection 15 mg (has no administration in time range)    ED Course  I have reviewed the triage vital signs and the nursing notes.  Pertinent labs & imaging results that were available during my care of the patient were reviewed by me and considered in my medical decision making (see chart for details).  34 year old female presenting with viral-type syndrome for several days, including headache, muscle aches, fatigue.    She has a benign abdominal exam.  Doubt acute intraabdominal infection.  No signs or symptoms of meningitis.  Suspect HA is viral.  Vitals wnl.  Doubt sepsis.    Labs reviewed - BMP wnl.  WBC 5.5.  UA negative.  Preg native.  Covid & flu negative.  No significant anemia noted.  HA treated with IV medications, and she subsequently had significant improvement.  Okay for discharge home.    Clinical Course as of Jan 16 2049  Mon Jan 17, 2020  2050 Reassessment the patient is feeling significantly better.  I reviewed her work-up with her and explained I think she likely has a viral syndrome.  I will discharge her home.   [MT]    Clinical Course User Index [MT] Beckham Capistran, Kermit Balo, MD   Final Clinical Impression(s) / ED Diagnoses Final diagnoses:  None    Rx / DC Orders ED Discharge Orders    None       Jordynn Marcella, Kermit Balo, MD 01/18/20 [phone]

## 2020-01-17 NOTE — ED Notes (Signed)
Pt reports generalized fatigue and headache for past week, has taken multiple goody powders without improvement in symptoms.  Last taken Saturday.

## 2022-02-14 ENCOUNTER — Encounter (HOSPITAL_BASED_OUTPATIENT_CLINIC_OR_DEPARTMENT_OTHER): Payer: Self-pay | Admitting: Emergency Medicine

## 2022-02-14 ENCOUNTER — Other Ambulatory Visit: Payer: Self-pay

## 2022-02-14 ENCOUNTER — Emergency Department (HOSPITAL_BASED_OUTPATIENT_CLINIC_OR_DEPARTMENT_OTHER)
Admission: EM | Admit: 2022-02-14 | Discharge: 2022-02-14 | Disposition: A | Discharge status: Home / Self Care | Payer: Medicaid Other | Attending: Emergency Medicine | Admitting: Emergency Medicine

## 2022-02-14 DIAGNOSIS — R42 Dizziness and giddiness: Secondary | ICD-10-CM | POA: Diagnosis present

## 2022-02-14 DIAGNOSIS — R55 Syncope and collapse: Secondary | ICD-10-CM | POA: Diagnosis not present

## 2022-02-14 DIAGNOSIS — E860 Dehydration: Secondary | ICD-10-CM | POA: Insufficient documentation

## 2022-02-14 DIAGNOSIS — I10 Essential (primary) hypertension: Secondary | ICD-10-CM | POA: Diagnosis not present

## 2022-02-14 LAB — BASIC METABOLIC PANEL
Anion gap: 7 (ref 5–15)
BUN: 19 mg/dL (ref 6–20)
CO2: 23 mmol/L (ref 22–32)
Calcium: 9.3 mg/dL (ref 8.9–10.3)
Chloride: 106 mmol/L (ref 98–111)
Creatinine, Ser: 0.71 mg/dL (ref 0.44–1.00)
GFR, Estimated: >60 mL/min (ref >60)
Glucose, Bld: 121 mg/dL — ABNORMAL HIGH (ref 70–99)
Potassium: 3.6 mmol/L (ref 3.5–5.1)
Sodium: 136 mmol/L (ref 135–145)

## 2022-02-14 LAB — CBC
HCT: 33.6 % — ABNORMAL LOW (ref 36.0–46.0)
Hemoglobin: 11.5 g/dL — ABNORMAL LOW (ref 12.0–15.0)
MCH: 30.2 pg (ref 26.0–34.0)
MCHC: 34.2 g/dL (ref 30.0–36.0)
MCV: 88.2 fL (ref 80.0–100.0)
Platelets: 334 10*3/uL (ref 150–400)
RBC: 3.81 MIL/uL — ABNORMAL LOW (ref 3.87–5.11)
RDW: 13.8 % (ref 11.5–15.5)
WBC: 7.5 10*3/uL (ref 4.0–10.5)
nRBC: 0 % (ref 0.0–0.2)

## 2022-02-14 LAB — CBG MONITORING, ED: Glucose-Capillary: 106 mg/dL — ABNORMAL HIGH (ref 70–99)

## 2022-02-14 MED ORDER — LACTATED RINGERS IV BOLUS
1000.0000 mL | Freq: Once | INTRAVENOUS | Status: AC
  Administered 2022-02-14: 1000 mL via INTRAVENOUS

## 2022-02-14 NOTE — ED Notes (Signed)
Patient given ginger ale per request

## 2022-02-14 NOTE — Discharge Instructions (Addendum)
Today you were seen in the emergency department for your fainting (syncope).    In the emergency department you had lab work and an EKG that was reassuring.    At home, please stay well-hydrated.    Check your MyChart online for the results of any tests that had not resulted by the time you left the emergency department.   Follow-up with your primary doctor in 2-3 days regarding your visit.    Return immediately to the emergency department if you experience any of the following: Passing out, chest pain, shortness of breath, or any other concerning symptoms.    Thank you for visiting our Emergency Department. It was a pleasure taking care of you today.

## 2022-02-14 NOTE — ED Provider Notes (Signed)
[facility] EMERGENCY DEPARTMENT Provider Note   CSN: [id number] Arrival date & time: 02/14/22  2026     History  Chief Complaint  Patient presents with   Dizziness   Weakness    Elizabeth Duffy is a 36 y.o. female.  36 year old female with a history of hypertension who presents to the emergency department with dizziness.  Patient states that she was at an exercise class that was a jazz/pop class when she started feeling very lightheaded.  Says that she walked her car afterwards and had an event where she lost consciousness and her friends caught her and she did not hit her head.  Says that she has not had any pain in her head or chest.  No shortness of breath.  No palpitations preceding this.  Last time she had a syncopal episode was due to her preeclampsia years ago.  Says that she has been under significant stress because her son died 3 months ago and thinks this may be related.  Says that she has had normal p.o. intake recently.   Past Medical History:  Diagnosis Date   HA (headache)    Hypertension    Ovarian cyst       Home Medications Prior to Admission medications   Medication Sig Start Date End Date Taking? Authorizing Provider  ibuprofen (ADVIL,MOTRIN) 600 MG tablet Take 1 tablet (600 mg total) by mouth every 6 (six) hours as needed. 05/14/17   Horton, Mayer Masker, MD      Allergies    Patient has no known allergies.    Review of Systems   Review of Systems  Physical Exam Updated Vital Signs BP 111/70   Pulse 70   Temp 98 F (36.7 C) (Oral)   Resp 16   Ht 5\' 2"  (1.575 m)   Wt 67.6 kg   LMP 01/27/2022   SpO2 100%   BMI 27.25 kg/m  Physical Exam Vitals and nursing note reviewed.  Constitutional:      General: She is not in acute distress.    Appearance: She is well-developed.  HENT:     Head: Normocephalic and atraumatic.     Right Ear: External ear normal.     Left Ear: External ear normal.     Nose: Nose normal.  Eyes:      Extraocular Movements: Extraocular movements intact.     Conjunctiva/sclera: Conjunctivae normal.     Pupils: Pupils are equal, round, and reactive to light.     Comments: Pupils 3 mm bilaterally  Cardiovascular:     Rate and Rhythm: Normal rate and regular rhythm.     Heart sounds: No murmur heard. Pulmonary:     Effort: Pulmonary effort is normal. No respiratory distress.     Breath sounds: Normal breath sounds.  Abdominal:     General: Abdomen is flat. There is no distension.     Palpations: Abdomen is soft. There is no mass.     Tenderness: There is no abdominal tenderness. There is no guarding.  Musculoskeletal:        General: No swelling.     Cervical back: Normal range of motion and neck supple.     Right lower leg: No edema.     Left lower leg: No edema.  Skin:    General: Skin is warm and dry.     Capillary Refill: Capillary refill takes 2 to 3 seconds.  Neurological:     Mental Status: She is alert and oriented to person,  place, and time. Mental status is at baseline.  Psychiatric:        Mood and Affect: Mood normal.     ED Results / Procedures / Treatments   Labs (all labs ordered are listed, but only abnormal results are displayed) Labs Reviewed  BASIC METABOLIC PANEL - Abnormal; Notable for the following components:      Result Value   Glucose, Bld 121 (*)    All other components within normal limits  CBC - Abnormal; Notable for the following components:   RBC 3.81 (*)    Hemoglobin 11.5 (*)    HCT 33.6 (*)    All other components within normal limits  CBG MONITORING, ED - Abnormal; Notable for the following components:   Glucose-Capillary 106 (*)    All other components within normal limits  CBG MONITORING, ED    EKG EKG Interpretation  Date/Time:  Thursday February 14 2022 20:34:44 EDT Ventricular Rate:  67 PR Interval:  160 QRS Duration: 92 QT Interval:  390 QTC Calculation: 412 R Axis:   57 Text Interpretation: Normal sinus rhythm with  sinus arrhythmia Normal ECG When compared with ECG of 14-May-2017 01:26, sinus arrythmia now present Confirmed by Margaretmary Eddy [phone]) on 02/14/2022 8:53:01 PM  Radiology No results found.  Procedures Procedures   Medications Ordered in ED Medications  lactated ringers bolus 1,000 mL (0 mLs Intravenous Stopped 02/14/22 2206)    ED Course/ Medical Decision Making/ A&P                           Medical Decision Making Amount and/or Complexity of Data Reviewed Labs: ordered.   Elizabeth Duffy is a 36 y.o. female with comorbidities that complicate the patient evaluation including hypertension who presents with chief complaint of lightheadedness and syncope.  This patient presents to the ED for concern of complaints listed in HPI, this involves an extensive number of treatment options, and is a complaint that carries with it a high risk of complications and morbidity. Disposition including potential need for admission considered.   Initial Ddx:  Vasovagal syncope, dehydration, arrhythmia, MI, PE  MDM:  Feel the patient had a syncopal event likely from combined dehydration due to exertion from her exercise class.  Feel that the stress of her recent personal loss may also be playing a role.  Will obtain EKG and electrolytes to evaluate for arrhythmia.  No chest pain or shortness of breath that would be concerning for an MI or PE.  Plan:  Labs EKG IV fluids  ED Summary/Re-evaluation:  Patient was reassessed and is feeling improved after the IV fluids.  Lab work did not show any concerning findings.  EKG and chest x-ray without acute findings either.  Return precautions discussed with the patient prior to discharge and she was instructed to follow-up with her primary doctor to continue her evaluation.  Dispo: DC Home. Return precautions discussed including, but not limited to, those listed in the AVS. Allowed pt time to ask questions which were answered fully prior to  dc.   Additional history obtained from family Records reviewed Outpatient Clinic Notes The following labs were independently interpreted: Chemistry and show no acute abnormality I personally reviewed and interpreted cardiac monitoring: normal sinus rhythm  I personally reviewed and interpreted the pt's EKG: see above for interpretation  I have reviewed the patients home medications and made adjustments as needed  Final Clinical Impression(s) / ED Diagnoses Final diagnoses:  Dehydration  Syncope, unspecified syncope type    Rx / DC Orders ED Discharge Orders     None         Rondel Baton, MD 02/15/22 1106

## 2022-02-14 NOTE — ED Triage Notes (Signed)
Pt was an exercise class and started feeling dizzy and faint with numbness. Pt sluggish in triage.

## 2023-04-18 ENCOUNTER — Encounter (HOSPITAL_BASED_OUTPATIENT_CLINIC_OR_DEPARTMENT_OTHER): Payer: Self-pay

## 2023-04-18 ENCOUNTER — Emergency Department (HOSPITAL_BASED_OUTPATIENT_CLINIC_OR_DEPARTMENT_OTHER): Payer: Medicaid Other

## 2023-04-18 ENCOUNTER — Emergency Department (HOSPITAL_BASED_OUTPATIENT_CLINIC_OR_DEPARTMENT_OTHER)
Admission: EM | Admit: 2023-04-18 | Discharge: 2023-04-18 | Discharge status: Against Medical Advice | Payer: Medicaid Other | Attending: Emergency Medicine | Admitting: Emergency Medicine

## 2023-04-18 ENCOUNTER — Other Ambulatory Visit: Payer: Self-pay

## 2023-04-18 DIAGNOSIS — Z20822 Contact with and (suspected) exposure to covid-19: Secondary | ICD-10-CM | POA: Diagnosis not present

## 2023-04-18 DIAGNOSIS — R200 Anesthesia of skin: Secondary | ICD-10-CM | POA: Insufficient documentation

## 2023-04-18 DIAGNOSIS — R0981 Nasal congestion: Secondary | ICD-10-CM | POA: Insufficient documentation

## 2023-04-18 DIAGNOSIS — M791 Myalgia, unspecified site: Secondary | ICD-10-CM | POA: Diagnosis not present

## 2023-04-18 DIAGNOSIS — R059 Cough, unspecified: Secondary | ICD-10-CM | POA: Insufficient documentation

## 2023-04-18 DIAGNOSIS — Z5321 Procedure and treatment not carried out due to patient leaving prior to being seen by health care provider: Secondary | ICD-10-CM | POA: Diagnosis not present

## 2023-04-18 LAB — RESP PANEL BY RT-PCR (RSV, FLU A&B, COVID)  RVPGX2
Influenza A by PCR: NEGATIVE
Influenza B by PCR: NEGATIVE
Resp Syncytial Virus by PCR: NEGATIVE
SARS Coronavirus 2 by RT PCR: NEGATIVE

## 2023-04-18 NOTE — ED Triage Notes (Signed)
 The patient has cough, congestion, body aches, and numbness to bottom lip since 12/21.
